# Patient Record
Sex: Male | Born: 1950 | Race: Black or African American | Hispanic: No | Marital: Single | Smoking: Never smoker
Health system: Southern US, Community
[De-identification: ages and names within clinical notes are randomized; demographics above are authoritative.]

## PROBLEM LIST (undated history)

## (undated) HISTORY — PX: HERNIA REPAIR: SHX51

## (undated) HISTORY — PX: FRACTURE SURGERY: SHX138

---

## 1998-04-26 ENCOUNTER — Emergency Department (HOSPITAL_COMMUNITY): Admission: EM | Admit: 1998-04-26 | Discharge: 1998-04-27 | Payer: Self-pay | Admitting: Emergency Medicine

## 1998-11-07 ENCOUNTER — Emergency Department (HOSPITAL_COMMUNITY): Admission: EM | Admit: 1998-11-07 | Discharge: 1998-11-07 | Payer: Self-pay | Admitting: Emergency Medicine

## 1999-08-20 ENCOUNTER — Emergency Department (HOSPITAL_COMMUNITY): Admission: EM | Admit: 1999-08-20 | Discharge: 1999-08-20 | Payer: Self-pay | Admitting: Emergency Medicine

## 2002-04-23 ENCOUNTER — Emergency Department (HOSPITAL_COMMUNITY): Admission: EM | Admit: 2002-04-23 | Discharge: 2002-04-23 | Payer: Self-pay | Admitting: Emergency Medicine

## 2002-04-23 ENCOUNTER — Encounter: Payer: Self-pay | Admitting: Emergency Medicine

## 2004-06-16 ENCOUNTER — Emergency Department (HOSPITAL_COMMUNITY): Admission: EM | Admit: 2004-06-16 | Discharge: 2004-06-16 | Payer: Self-pay

## 2005-05-20 ENCOUNTER — Emergency Department (HOSPITAL_COMMUNITY): Admission: EM | Admit: 2005-05-20 | Discharge: 2005-05-20 | Payer: Self-pay | Admitting: Emergency Medicine

## 2005-10-09 ENCOUNTER — Emergency Department (HOSPITAL_COMMUNITY): Admission: EM | Admit: 2005-10-09 | Discharge: 2005-10-09 | Payer: Self-pay | Admitting: Emergency Medicine

## 2006-05-26 ENCOUNTER — Emergency Department (HOSPITAL_COMMUNITY): Admission: EM | Admit: 2006-05-26 | Discharge: 2006-05-26 | Payer: Self-pay | Admitting: Emergency Medicine

## 2006-08-16 ENCOUNTER — Emergency Department (HOSPITAL_COMMUNITY): Admission: EM | Admit: 2006-08-16 | Discharge: 2006-08-16 | Payer: Self-pay | Admitting: Emergency Medicine

## 2008-06-08 ENCOUNTER — Ambulatory Visit: Payer: Self-pay | Admitting: Internal Medicine

## 2008-06-08 ENCOUNTER — Inpatient Hospital Stay (HOSPITAL_COMMUNITY): Admission: EM | Admit: 2008-06-08 | Discharge: 2008-06-10 | Payer: Self-pay | Admitting: Emergency Medicine

## 2008-06-17 ENCOUNTER — Telehealth (INDEPENDENT_AMBULATORY_CARE_PROVIDER_SITE_OTHER): Payer: Self-pay | Admitting: *Deleted

## 2008-06-27 ENCOUNTER — Ambulatory Visit: Payer: Self-pay | Admitting: Internal Medicine

## 2008-06-27 DIAGNOSIS — R030 Elevated blood-pressure reading, without diagnosis of hypertension: Secondary | ICD-10-CM

## 2008-06-27 DIAGNOSIS — E785 Hyperlipidemia, unspecified: Secondary | ICD-10-CM | POA: Insufficient documentation

## 2008-06-27 DIAGNOSIS — I728 Aneurysm of other specified arteries: Secondary | ICD-10-CM | POA: Insufficient documentation

## 2008-06-27 DIAGNOSIS — K219 Gastro-esophageal reflux disease without esophagitis: Secondary | ICD-10-CM

## 2008-06-27 DIAGNOSIS — R42 Dizziness and giddiness: Secondary | ICD-10-CM

## 2008-09-18 ENCOUNTER — Emergency Department (HOSPITAL_COMMUNITY): Admission: EM | Admit: 2008-09-18 | Discharge: 2008-09-18 | Payer: Self-pay | Admitting: Emergency Medicine

## 2008-09-20 ENCOUNTER — Emergency Department (HOSPITAL_COMMUNITY): Admission: EM | Admit: 2008-09-20 | Discharge: 2008-09-20 | Payer: Self-pay | Admitting: Emergency Medicine

## 2010-03-18 ENCOUNTER — Emergency Department (HOSPITAL_COMMUNITY): Admission: EM | Admit: 2010-03-18 | Discharge: 2010-03-18 | Payer: Self-pay | Admitting: Emergency Medicine

## 2010-05-31 ENCOUNTER — Emergency Department (HOSPITAL_COMMUNITY): Admission: EM | Admit: 2010-05-31 | Discharge: 2010-05-31 | Payer: Self-pay | Admitting: Emergency Medicine

## 2010-06-29 ENCOUNTER — Emergency Department (HOSPITAL_COMMUNITY): Admission: EM | Admit: 2010-06-29 | Discharge: 2010-06-29 | Payer: Self-pay | Admitting: Emergency Medicine

## 2010-08-07 ENCOUNTER — Emergency Department (HOSPITAL_COMMUNITY): Admission: EM | Admit: 2010-08-07 | Discharge: 2010-08-07 | Payer: Self-pay | Admitting: Emergency Medicine

## 2010-12-08 NOTE — Progress Notes (Signed)
Summary: phone/gg  Phone Note Call from Patient   Caller: Patient Summary of Call: Pt called and can not afford Zofran Rx.  Can he have something else? Pt # [id number] Initial call taken by: Merrie Roof RN,  June 17, 2008 10:58 AM  Follow-up for Phone Call        Talked with Dr Maple Hudson and he ordered Phenergan 25 mg take one every 6 hours as needed for nausea # 60 with one refill This was called into [facility]  Pharmacy Follow-up by: Merrie Roof RN,  June 17, 2008 11:05 AM  Additional Follow-up for Phone Call Additional follow up Details #1::        Prescription entered into EMR. Additional Follow-up by: Rufina Falco    New/Updated Medications: PROMETHAZINE HCL 25 MG  TABS (PROMETHAZINE HCL) Take one pill every six hours as needed for nausea   Prescriptions: PROMETHAZINE HCL 25 MG  TABS (PROMETHAZINE HCL) Take one pill every six hours as needed for nausea  #60 x 1   Entered and Authorized by:   Rufina Falco MD   Signed by:   Rufina Falco MD on 06/19/2008   Method used:   Telephoned to ...       [facility]       [address]       [place], [place]  [address]       Ph: [phone]       Fax: [phone]   RxID:   [id number]

## 2011-01-21 LAB — GLUCOSE, CAPILLARY

## 2011-01-26 LAB — URINE MICROSCOPIC-ADD ON

## 2011-01-26 LAB — GC/CHLAMYDIA PROBE AMP, GENITAL: GC Probe Amp, Genital: POSITIVE — AB

## 2011-01-26 LAB — URINALYSIS, ROUTINE W REFLEX MICROSCOPIC
Glucose, UA: NEGATIVE mg/dL
Ketones, ur: NEGATIVE mg/dL
Protein, ur: NEGATIVE mg/dL

## 2011-01-26 LAB — URINE CULTURE
Colony Count: NO GROWTH
Culture: NO GROWTH

## 2011-03-23 NOTE — Discharge Summary (Signed)
NAME:  Donald Kelly, Donald Kelly NO.:  [id number]   MEDICAL RECORD NO.:  [id number]          PATIENT TYPE:  INP   LOCATION:  4738                         FACILITY:  [facility]   PHYSICIAN:  Ileana Roup, M.D.  DATE OF BIRTH:  [birth year]   DATE OF ADMISSION:  06/08/2008  DATE OF DISCHARGE:  06/10/2008                               DISCHARGE SUMMARY   DISCHARGE DIAGNOSES   1. Dizziness/vertigo.  2. Chest pain/heartburn.  3. Elevated blood pressure.  4. Hyperlipidemia.   DISCHARGE MEDICATIONS   1. Meclizine 25 mg 1 pill every 6 hours as needed for dizziness.  2. Prilosec 20 mg 1 pill 2 times daily.  3. Pravachol 40 mg 1 pill daily.  4. Tylenol 325 mg 1-2 pills every 6 hours as needed for pain.  5. Zofran 4 mg 1 pill every 6 hours as needed for nausea.   DISPOSITION AND FOLLOWUP   Followup appointment with Dr. Maple Hudson on June 27, 2008, at 2 p.m. in the  [facility] internal medicine clinic.  Follow up on complaints  of dizziness and reflux and evaluation of the blood pressure.  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  Followup CMETs in 4-6 weeks as we started him on statin.   PROCEDURES PERFORMED   1. CT head without contrast.  Impression, normal CT of the head      without contrast.  2. Abdomen series.  No acute cardiopulmonary disease.  No evidence of      bowel obstruction or free intraperitoneal air.  3. MRI of brain.  No acute intracranial abnormality; normal MRI      appearance of the brain for age; diminutive posterior circulation,      see MRA findings below.  4. MRA head without contrast.  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      These can sometimes be associated with seventh or eight nerve      symptoms, although chronic symptomatology would be expected.  5. X-ray of the chest.  Impression, low volume exam.  No acute chest      process.   CONSULTATIONS  None.   BRIEF ADMITTING HISTORY AND PHYSICAL   Patient is a 60 year old male with past medical history of episodes of  vertigo and dizziness of unknown etiology who is taking meclizine, who  came to the ER with chief complaint of dizziness/vertigo that started  around 4 a.m. on June 08, 2008.  Dizziness started insidiously, and was  aggravated by lying down position and also by turning the head sideways.  Dizziness was relieved by standing  up position.  There is a history of  vertigo, but there is no history of tinnitus or hearing loss.  No  history of fever, nausea, or vomiting.  He had a history of multiple  visits to ER in 2006 and 2007 and to the [facility] with  similar complaints.  He is also complaining of heartburn/chest pain for  quite a long time.  The heartburn starts at around 2-3 hours after  eating food and lasts about 30 minutes; it is retrosternal, sharp, and  nonradiating.  He reports no aggravating factors; it is relieved by  drinking milk.  He denies associated shortness of breath, chills,  palpitations, nausea, or vomiting.  He reports chronic alcohol  consumption of about 2 beers every day and sometimes 4-6 beers a day.  He is taking Advil 200 mg once in a while for chest pain.  He denies any  abdominal pain or diarrhea, and had a bowel movement today.   ALLERGIES   No known allergies.   PAST MEDICAL HISTORY  Similar presentation in June and July 2007, with nausea, vomiting,  dizziness, and intermittent chest pain.  History of vertigo.  Gonorrhea  treated in December 2006 and hiatal hernia repair.   MEDICATIONS AT HOME  Aleve, Advil, and  meclizine.   LABORATORY DATA   His admitting labs are:  CBC:  WBC count 5.2, RBC 4.8, hemoglobin 12.2,  hematocrit 38.7, MCV 80.6, MCHC 31.6, and platelet count 274.  Cardiac  enzymes x3 were negative.  TSH 1.424.  Serum lipase 21.  PT 13.4, INR  1.0, PTT 26.  Urinalysis is negative.  Urine for the drug screen is  negative.  HbA1c is 5.5.  BMP:  Sodium 138, potassium 4.4, chloride 108,  carbon dioxide 26, glucose 130, BUN 5, and creatinine 0.79.  Total  bilirubin 1.1, alkaline phosphatase 28, SGOT 20, SGPT 26, total protein  6.9, albumin blood 3.8, and calcium 8.9.   EKG was normal.   HOSPITAL COURSE   1. Dizziness/vertigo.  Symptoms are likely secondary to benign      paroxysmal positional vertigo.  The patient has a past history of      vertigo.  CT head was negative.  There were no acute neurological      findings.  The patient was treated with meclizine for the dizziness      and with Zofran for nausea.  He had an MRA/MRI of the head to rule      out any posterior circulation stroke with results as above.  We      talked with the Radiology regarding the MRI/MRA findings.  They      felt that the MRI/MRA findings would not likely result in the      dizziness of this patient.  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      The patient responded to meclizine and Zofran.  The patient did not      have any dizziness on the second day of the admission.  The patient      was later discharged on meclizine and Zofran.  2. Chest pain/heartburn.  The chest pain was likely GI in nature, with      differential including peptic ulcer disease versus GERD.  His serum      lipase levels were negative.  EKG was normal.  Cardiac enzymes x3  were normal.  Urinalysis was normal.  Urine for drug screening was      normal.  We started him on a PPI and the patient responded nicely.      We sent him home on Prilosec 20 mg 1 pill  2 times daily.  3. Increased blood pressures.  The patient had multiple readings of      increased blood pressure and the patient will likely need treatment      as an outpatient.  We are not starting him on any treatment for the      increased blood pressure.  He has an appointment in the outpatient      clinic with Dr. Maple Hudson on June 27, 2008, at 2 p.m. and he will      monitor the blood pressure and decide the need for any medications      for high blood pressure.  4. Alcohol use.  We started him on thiamine as well as folic acid      supplementation.  We advised the patient on alcohol cessation.  5. Prophylaxis.  SCDs were used for DVT prophylaxis.   DISCHARGE VITALS   His temperature is 97.2, pulse 71, respirations 20, blood pressure  151/91, and oxygen saturation 94% on room air.   DISCHARGE LABS   CBC:  WBC count 5.8, RBC 5.56, hemoglobin 14.1, hematocrit 45.3, MCV  81.4, MCHC 31.0, RDW 14.1, and platelet count 316.  Basic Metabolic  Panel:  Sodium 138, potassium 4.1, chloride 105, carbon dioxide 25,  glucose 16, BUN 13, creatinine 0.59, and calcium 9.3.   At the time of discharge patient was stable, alert, oriented and not in  any distress.      Blondell Reveal, MD  Electronically Signed      Ileana Roup, M.D.  Electronically Signed    VB/MEDQ  D:  06/10/2008  T:  06/11/2008  Job:  [id number]

## 2011-08-06 LAB — LIPID PANEL
Cholesterol: 279 — ABNORMAL HIGH
HDL: 30 — ABNORMAL LOW
LDL Cholesterol: 218 — ABNORMAL HIGH
Total CHOL/HDL Ratio: 9.3
Triglycerides: 156 — ABNORMAL HIGH
VLDL: 31

## 2011-08-06 LAB — DIFFERENTIAL
Basophils Absolute: 0
Basophils Relative: 1
Monocytes Absolute: 0.3
Neutro Abs: 3.3
Neutrophils Relative %: 65

## 2011-08-06 LAB — CBC
HCT: 39.1
Hemoglobin: 12.2 — ABNORMAL LOW
Hemoglobin: 12.5 — ABNORMAL LOW
MCHC: 31.6
MCHC: 32
MCV: 79.7
Platelets: 316
RDW: 14.1
RDW: 14.2
RDW: 14.3
WBC: 5.8

## 2011-08-06 LAB — BASIC METABOLIC PANEL
BUN: 13
CO2: 25
Calcium: 9
Calcium: 9.3
Chloride: 110
Creatinine, Ser: 0.89
GFR calc non Af Amer: >60
Glucose, Bld: 109 — ABNORMAL HIGH
Glucose, Bld: 156 — ABNORMAL HIGH
Sodium: 141

## 2011-08-06 LAB — URINE CULTURE
Colony Count: NO GROWTH
Culture: NO GROWTH

## 2011-08-06 LAB — CARDIAC PANEL(CRET KIN+CKTOT+MB+TROPI)
Relative Index: 1.6
Total CK: 134
Total CK: 154

## 2011-08-06 LAB — URINALYSIS, ROUTINE W REFLEX MICROSCOPIC
Glucose, UA: NEGATIVE
Ketones, ur: NEGATIVE
pH: 6.5

## 2011-08-06 LAB — HEMOGLOBIN A1C
Hgb A1c MFr Bld: 5.5
Mean Plasma Glucose: 119

## 2011-08-06 LAB — COMPREHENSIVE METABOLIC PANEL
AST: 20
Albumin: 3.8
Alkaline Phosphatase: 48
Chloride: 108
GFR calc Af Amer: >60
Potassium: 4.4
Sodium: 138
Total Bilirubin: 1.1

## 2011-08-06 LAB — RAPID URINE DRUG SCREEN, HOSP PERFORMED: Tetrahydrocannabinol: NOT DETECTED

## 2011-08-06 LAB — POCT I-STAT, CHEM 8
Calcium, Ion: 1.18
Creatinine, Ser: 1.1
Glucose, Bld: 156 — ABNORMAL HIGH
Hemoglobin: 13.9
TCO2: 24

## 2011-08-06 LAB — APTT: aPTT: 26

## 2011-08-06 LAB — TSH: TSH: 1.424

## 2011-08-10 LAB — URINE CULTURE: Colony Count: NO GROWTH

## 2011-08-10 LAB — URINALYSIS, ROUTINE W REFLEX MICROSCOPIC
Bilirubin Urine: NEGATIVE
Bilirubin Urine: NEGATIVE
Glucose, UA: NEGATIVE
Nitrite: NEGATIVE
Specific Gravity, Urine: 1.006
Specific Gravity, Urine: 1.012
Urobilinogen, UA: 0.2
pH: 6
pH: 6

## 2011-08-10 LAB — DIFFERENTIAL
Eosinophils Relative: 1
Lymphocytes Relative: 30
Monocytes Absolute: 0.3
Monocytes Relative: 4
Neutro Abs: 4.4

## 2011-08-10 LAB — CBC
HCT: 42.5
Hemoglobin: 13.7
RBC: 5.34
RDW: 14.1

## 2011-08-10 LAB — URINE MICROSCOPIC-ADD ON

## 2011-08-10 LAB — POCT I-STAT, CHEM 8
BUN: 11
Calcium, Ion: 1.21
Creatinine, Ser: 1
Glucose, Bld: 88
Sodium: 141
TCO2: 25

## 2012-05-23 IMAGING — CR DG CHEST 2V
2 series · 2 of 2 positions shown · non-contrast
Comparison: Chest 06/08/2008.

CLINICAL DATA: Cough and weakness.

CHEST - 2 VIEW

[w chest pa]
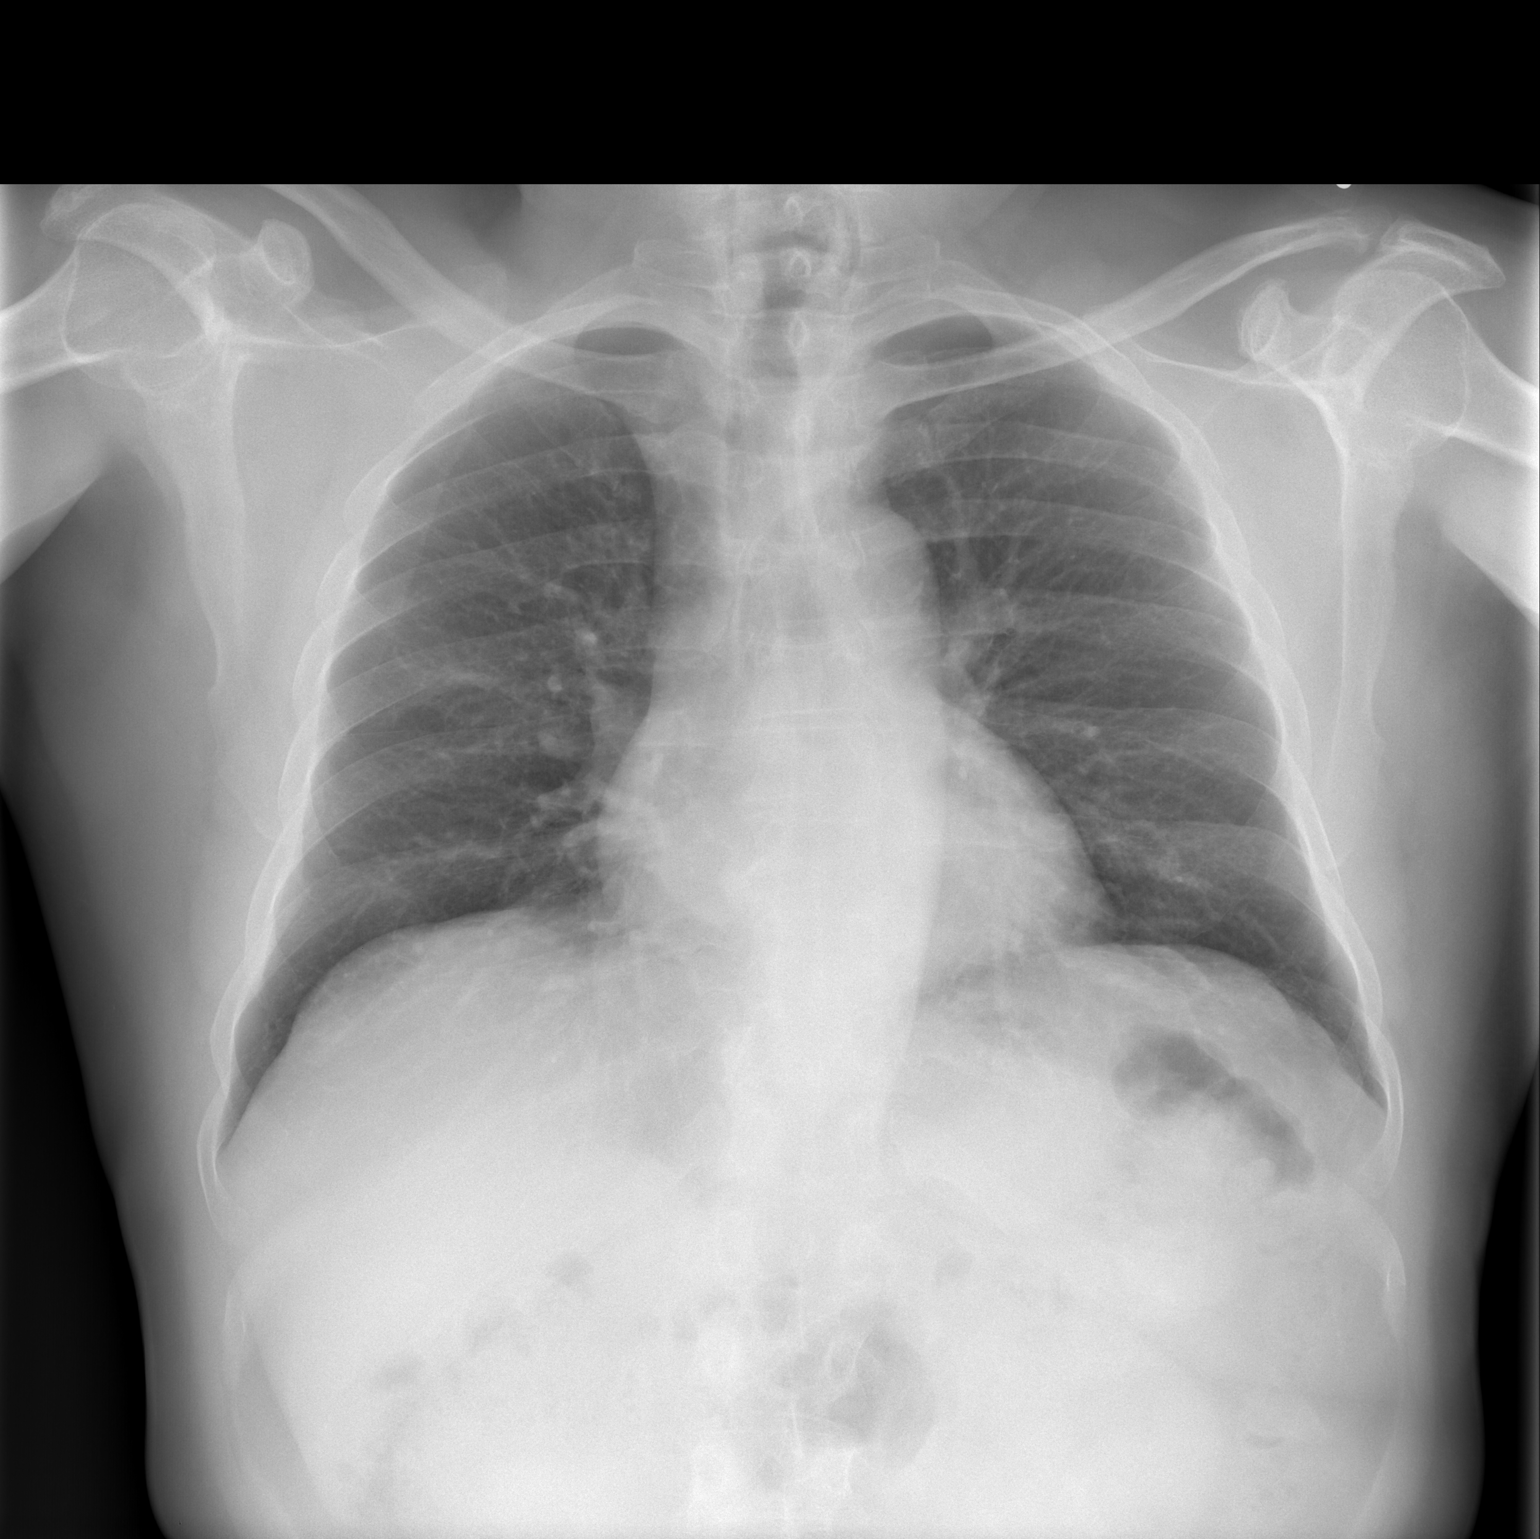

[w chest lat]
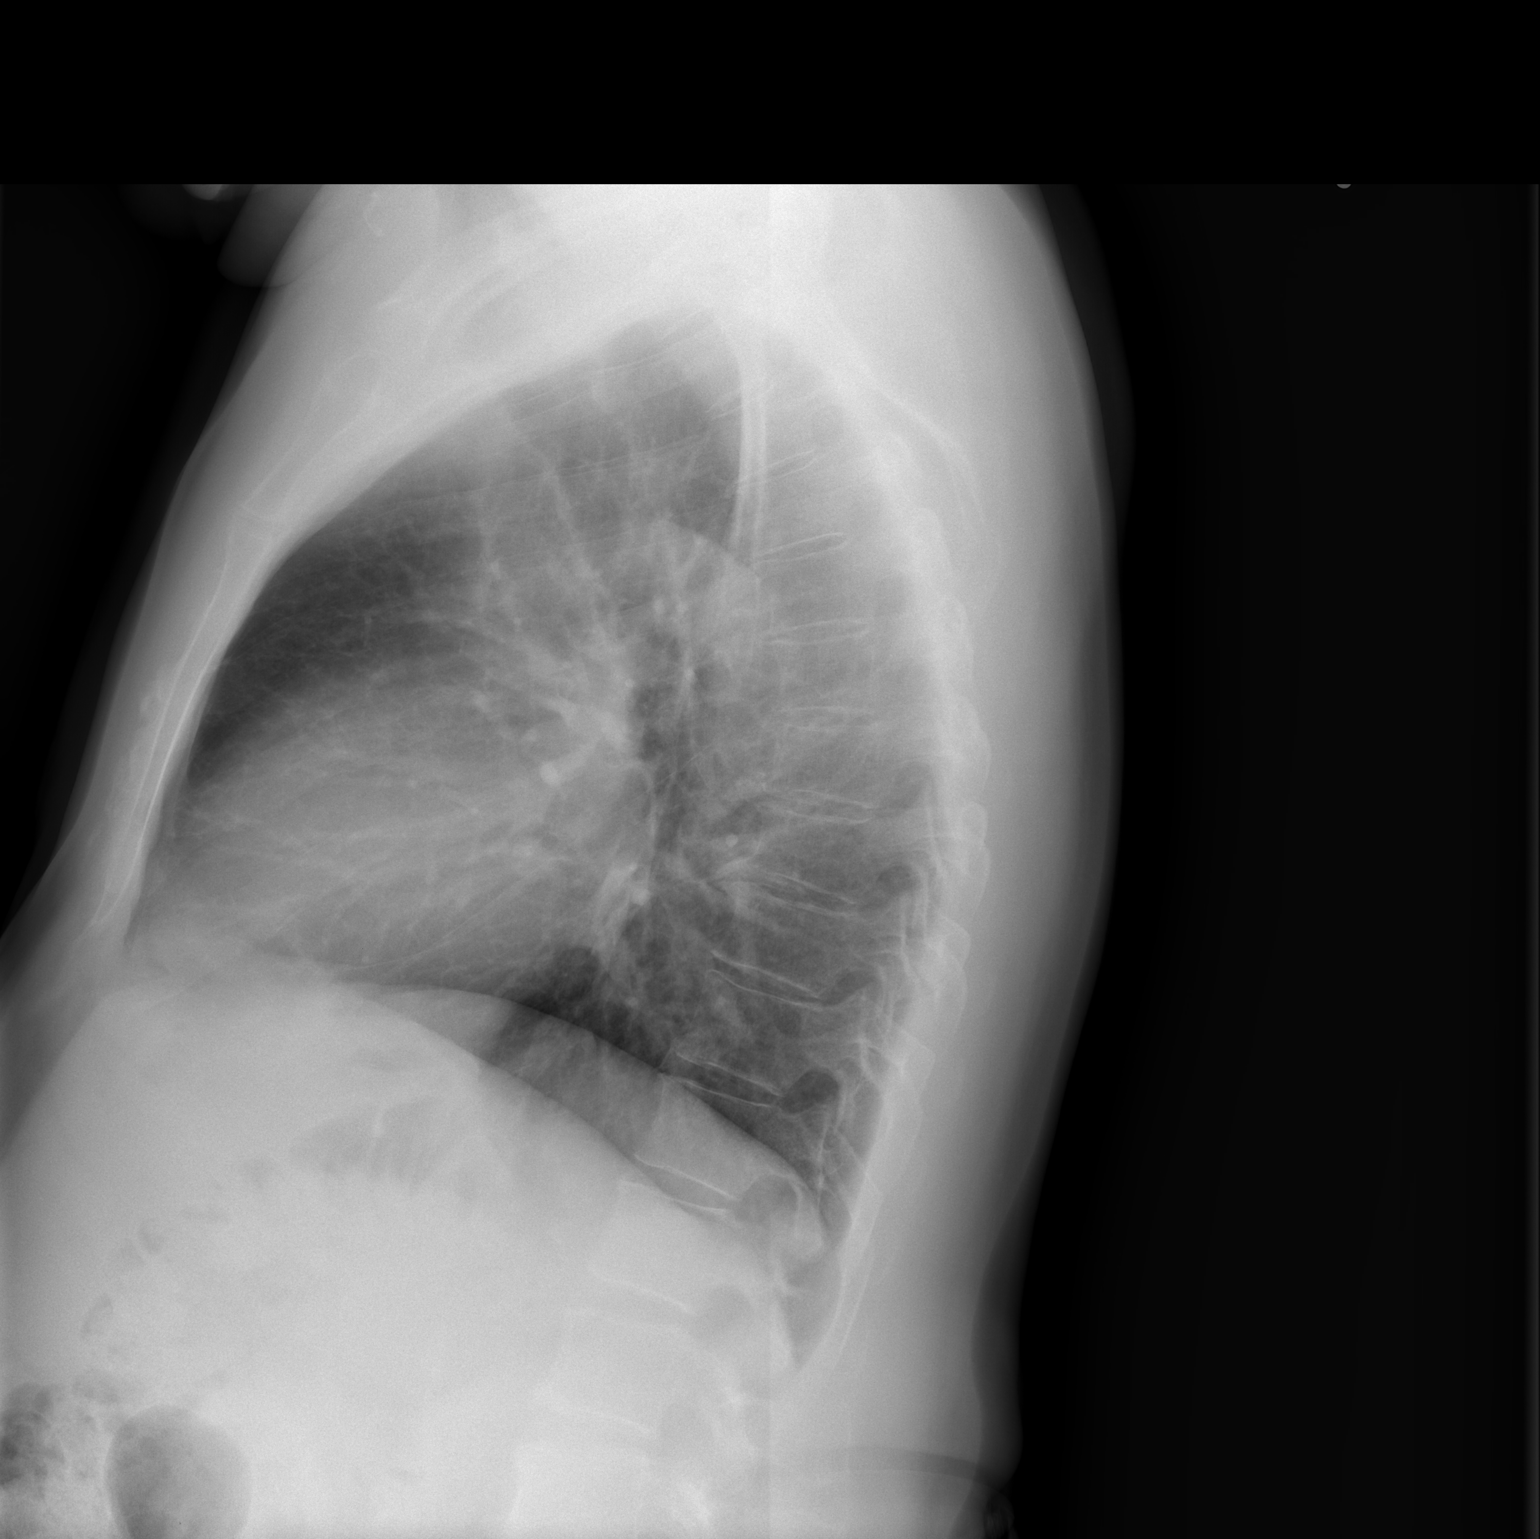

[2 of 2 positions shown; findings below may reference images not displayed]

FINDINGS: Lungs are clear.  No effusion.  Heart size normal.
IMPRESSION: No acute disease.

## 2012-06-22 ENCOUNTER — Emergency Department (HOSPITAL_COMMUNITY)
Admission: EM | Admit: 2012-06-22 | Discharge: 2012-06-22 | Disposition: A | Discharge status: Home / Self Care | Payer: Self-pay | Attending: Emergency Medicine | Admitting: Emergency Medicine

## 2012-06-22 DIAGNOSIS — M79609 Pain in unspecified limb: Secondary | ICD-10-CM | POA: Insufficient documentation

## 2012-06-22 DIAGNOSIS — L03039 Cellulitis of unspecified toe: Secondary | ICD-10-CM | POA: Insufficient documentation

## 2012-06-22 DIAGNOSIS — L03031 Cellulitis of right toe: Secondary | ICD-10-CM

## 2012-06-22 MED ORDER — CEPHALEXIN 500 MG PO CAPS: 500.0000 mg | ORAL_CAPSULE | Freq: Three times a day (TID) | ORAL | Status: AC

## 2012-06-22 MED ORDER — CEPHALEXIN 500 MG PO CAPS
500.0000 mg | ORAL_CAPSULE | Freq: Once | ORAL | Status: AC
  Administered 2012-06-22: 500 mg via ORAL
  Filled 2012-06-22: qty 1

## 2012-06-22 MED ORDER — TRAMADOL HCL 50 MG PO TABS
50.0000 mg | ORAL_TABLET | Freq: Once | ORAL | Status: AC
  Administered 2012-06-22: 50 mg via ORAL
  Filled 2012-06-22: qty 1

## 2012-06-22 MED ORDER — TRAMADOL HCL 50 MG PO TABS: 50.0000 mg | ORAL_TABLET | Freq: Four times a day (QID) | ORAL | Status: AC | PRN

## 2012-06-22 NOTE — ED Notes (Signed)
Pt verbalizes understanding 

## 2012-06-22 NOTE — ED Provider Notes (Signed)
History     CSN: [id number]  Arrival date & time 06/22/12  1845   First MD Initiated Contact with Patient 06/22/12 1848      Chief Complaint  Patient presents with  . Toe Pain    (Consider location/radiation/quality/duration/timing/severity/associated sxs/prior treatment) HPI  61 y.o. male in no acute distress complaining of pain to right great toe worsening over the course of one week with expression of purulent material. Patient states he is not in pain except for when he wears shoes and at that point it is 5/10, sharp, nonradiating and exacerbated with weightbearing. Patient has never had a similar prior episode and he denies fever, nausea and vomiting.   No past medical history on file.  No past surgical history on file.  No family history on file.  History  Substance Use Topics  . Smoking status: Not on file  . Smokeless tobacco: Not on file  . Alcohol Use: Not on file      Review of Systems  Musculoskeletal: Positive for myalgias.  All other systems reviewed and are negative.    Allergies  Review of patient's allergies indicates no known allergies.  Home Medications   Current Outpatient Rx  Name Route Sig Dispense Refill  . CEPHALEXIN 500 MG PO CAPS Oral Take 1 capsule (500 mg total) by mouth 3 (three) times daily. 21 capsule 0  . TRAMADOL HCL 50 MG PO TABS Oral Take 1 tablet (50 mg total) by mouth every 6 (six) hours as needed for pain. 15 tablet 0    BP 135/87  Pulse 73  Temp 98.2 F (36.8 C) (Oral)  Resp 14  SpO2 99%  Physical Exam  Nursing note and vitals reviewed. Constitutional: He is oriented to person, place, and time. He appears well-developed and well-nourished. No distress.  HENT:  Head: Normocephalic.  Eyes: Conjunctivae and EOM are normal.  Cardiovascular: Normal rate.   Pulmonary/Chest: Effort normal.  Musculoskeletal: Normal range of motion.  Neurological: He is alert and oriented to person, place, and time.  Skin:   Paronychia to the right great toe with erythema and swelling to the base of the nail bed.  Psychiatric: He has a normal mood and affect.    ED Course  Procedures (including critical care time)  INCISION AND DRAINAGE Performed by: Wynetta Emery Consent: Verbal consent obtained. Risks and benefits: risks, benefits and alternatives were discussed Type: Paronychia   Body area: Right great toe  Anesthesia: None  Local anesthetic: None   Anesthetic total: None  Complexity: c simple  Drainage: purulent  Drainage amount: 0.58mL  Packing material: None   Patient tolerance: Patient tolerated the procedure well with no immediate complications.     Labs Reviewed - No data to display No results found.   1. Paronychia of great toe, right       MDM  Patient with right great toe paronychia attempted incision and drainage but produces small amount of purulent drainage. I think the majority of the pus previously expressed. I'm going to start the patient on Keflex and prescribe him tramadol for pain.        Wynetta Emery, PA-C 06/22/12 1942

## 2012-06-22 NOTE — ED Provider Notes (Signed)
Medical screening examination/treatment/procedure(s) were performed by non-physician practitioner and as supervising physician I was immediately available for consultation/collaboration.  Elder Davidian T Dilana Mcphie, MD 06/22/12 2319 

## 2012-06-22 NOTE — ED Notes (Signed)
Pt denies chills and fever.  Pt reports "mashing puss out of it"

## 2012-06-22 NOTE — ED Notes (Signed)
Pt unaware of any toe injury.  Pt states "been hurting for a week"  Pt right toe red and swollen

## 2012-11-06 ENCOUNTER — Encounter (HOSPITAL_COMMUNITY): Payer: Self-pay | Admitting: *Deleted

## 2012-11-06 ENCOUNTER — Emergency Department (HOSPITAL_COMMUNITY): Payer: Self-pay

## 2012-11-06 ENCOUNTER — Emergency Department (HOSPITAL_COMMUNITY)
Admission: EM | Admit: 2012-11-06 | Discharge: 2012-11-06 | Disposition: A | Discharge status: Home / Self Care | Payer: Self-pay | Attending: Emergency Medicine | Admitting: Emergency Medicine

## 2012-11-06 DIAGNOSIS — S39012A Strain of muscle, fascia and tendon of lower back, initial encounter: Secondary | ICD-10-CM

## 2012-11-06 DIAGNOSIS — Y929 Unspecified place or not applicable: Secondary | ICD-10-CM | POA: Insufficient documentation

## 2012-11-06 DIAGNOSIS — S335XXA Sprain of ligaments of lumbar spine, initial encounter: Secondary | ICD-10-CM | POA: Insufficient documentation

## 2012-11-06 DIAGNOSIS — X500XXA Overexertion from strenuous movement or load, initial encounter: Secondary | ICD-10-CM | POA: Insufficient documentation

## 2012-11-06 DIAGNOSIS — Z9889 Other specified postprocedural states: Secondary | ICD-10-CM | POA: Insufficient documentation

## 2012-11-06 DIAGNOSIS — Y990 Civilian activity done for income or pay: Secondary | ICD-10-CM | POA: Insufficient documentation

## 2012-11-06 MED ORDER — CYCLOBENZAPRINE HCL 10 MG PO TABS: 5.0000 mg | ORAL_TABLET | Freq: Two times a day (BID) | ORAL | Status: DC | PRN

## 2012-11-06 MED ORDER — HYDROCODONE-ACETAMINOPHEN 5-325 MG PO TABS
1.0000 | ORAL_TABLET | ORAL | Status: DC | PRN
Start: 2012-11-06 — End: 2012-11-06

## 2012-11-06 MED ORDER — IBUPROFEN 800 MG PO TABS: 800.0000 mg | ORAL_TABLET | Freq: Three times a day (TID) | ORAL | Status: DC

## 2012-11-06 MED ORDER — HYDROCODONE-ACETAMINOPHEN 5-325 MG PO TABS
1.0000 | ORAL_TABLET | ORAL | Status: DC | PRN
Start: 2012-11-06 — End: 2013-10-13

## 2012-11-06 NOTE — ED Notes (Signed)
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

## 2012-11-23 NOTE — ED Provider Notes (Signed)
History     CSN: [id number]  Arrival date & time 11/06/12  4098   First MD Initiated Contact with Patient 11/06/12 0940      No chief complaint on file.   (Consider location/radiation/quality/duration/timing/severity/associated sxs/prior treatment) Patient is a 62 y.o. male presenting with hip pain. The history is provided by the patient.  Hip Pain This is a new problem. The current episode started yesterday. Pertinent negatives include no chills, fever, numbness, rash or weakness. Associated symptoms comments: He complains of left hip soreness, worse with walking, better when resting, since lifting a tray at work. No numbness or tingling. No weakness, urinary or bowel incontinence. He states mild back pain but greatest discomfort is in the hip.Marland Kitchen    History reviewed. No pertinent past medical history.  Past Surgical History  Procedure Date  . Hernia repair     History reviewed. No pertinent family history.  History  Substance Use Topics  . Smoking status: Never Smoker   . Smokeless tobacco: Never Used  . Alcohol Use: Yes      Review of Systems  Constitutional: Negative for fever and chills.  Genitourinary: Negative for difficulty urinating.  Musculoskeletal:       See HPI  Skin: Negative.  Negative for rash and wound.  Neurological: Negative.  Negative for weakness and numbness.    Allergies  Review of patient's allergies indicates no known allergies.  Home Medications   Current Outpatient Rx  Name  Route  Sig  Dispense  Refill  . CYCLOBENZAPRINE HCL 10 MG PO TABS   Oral   Take 0.5 tablets (5 mg total) by mouth 2 (two) times daily as needed for muscle spasms.   20 tablet   0   . HYDROCODONE-ACETAMINOPHEN 5-325 MG PO TABS   Oral   Take 1 tablet by mouth every 4 (four) hours as needed for pain.   10 tablet   0   . IBUPROFEN 800 MG PO TABS   Oral   Take 1 tablet (800 mg total) by mouth 3 (three) times daily.   21 tablet   0     BP 175/98  Pulse  74  Temp 98 F (36.7 C) (Oral)  Resp 16  SpO2 100%  Physical Exam  Constitutional: He is oriented to person, place, and time. He appears well-developed and well-nourished.  Neck: Normal range of motion.  Cardiovascular:       Distal pulses 2+.  Pulmonary/Chest: Effort normal.  Abdominal: Soft. He exhibits no mass. There is no tenderness.  Musculoskeletal: Normal range of motion.       Left paralumbar tenderness that is mild and reproduces pain in left hip. No swelling, discoloration. No sciatic tenderness.   Neurological: He is alert and oriented to person, place, and time. He has normal reflexes. No sensory deficit.  Skin: Skin is warm and dry.  Psychiatric: He has a normal mood and affect.    ED Course  Procedures (including critical care time)  Labs Reviewed - No data to display No results found.   1. Lumbar strain       MDM  Suspect hip pain is mild lumbar radiculopathy from back strain. No neurologic deficits.         Arnoldo Hooker, PA-C 11/26/12 1030

## 2012-11-26 NOTE — ED Provider Notes (Signed)
Medical screening examination/treatment/procedure(s) were performed by non-physician practitioner and as supervising physician I was immediately available for consultation/collaboration.   Dione Booze, MD 11/26/12 [phone]

## 2012-11-28 ENCOUNTER — Encounter: Payer: Self-pay | Admitting: Medical

## 2013-10-13 ENCOUNTER — Other Ambulatory Visit: Payer: Self-pay

## 2013-10-13 ENCOUNTER — Emergency Department (HOSPITAL_COMMUNITY)
Admission: EM | Admit: 2013-10-13 | Discharge: 2013-10-13 | Disposition: A | Discharge status: Home / Self Care | Payer: Self-pay | Attending: Emergency Medicine | Admitting: Emergency Medicine

## 2013-10-13 ENCOUNTER — Encounter (HOSPITAL_COMMUNITY): Payer: Self-pay | Admitting: Emergency Medicine

## 2013-10-13 DIAGNOSIS — I10 Essential (primary) hypertension: Secondary | ICD-10-CM | POA: Insufficient documentation

## 2013-10-13 DIAGNOSIS — R5381 Other malaise: Secondary | ICD-10-CM | POA: Insufficient documentation

## 2013-10-13 DIAGNOSIS — R51 Headache: Secondary | ICD-10-CM | POA: Insufficient documentation

## 2013-10-13 LAB — CBC
MCH: 25.6 pg — ABNORMAL LOW (ref 26.0–34.0)
MCHC: 32.4 g/dL (ref 30.0–36.0)
MCV: 78.9 fL (ref 78.0–100.0)
Platelets: 305 10*3/uL (ref 150–400)
RBC: 5.36 MIL/uL (ref 4.22–5.81)

## 2013-10-13 LAB — BASIC METABOLIC PANEL
CO2: 25 mEq/L (ref 19–32)
Calcium: 9.8 mg/dL (ref 8.4–10.5)
Creatinine, Ser: 0.9 mg/dL (ref 0.50–1.35)
GFR calc non Af Amer: 89 mL/min — ABNORMAL LOW (ref >90)

## 2013-10-13 LAB — URINALYSIS, ROUTINE W REFLEX MICROSCOPIC
Leukocytes, UA: NEGATIVE
Nitrite: NEGATIVE
Protein, ur: NEGATIVE mg/dL
Specific Gravity, Urine: 1.011 (ref 1.005–1.030)
pH: 7 (ref 5.0–8.0)

## 2013-10-13 NOTE — ED Provider Notes (Addendum)
CSN: [id number]     Arrival date & time 10/13/13  4098 History   First MD Initiated Contact with Patient 10/13/13 [phone]     Chief Complaint  Patient presents with  . Fatigue  . Hypertension   (Consider location/radiation/quality/duration/timing/severity/associated sxs/prior Treatment) HPI Pt presents with c/o concern for generalized fatigue, frontal headaches and has found that he had high blood pressure at a reading at drug store last night.  He states he has been intermittently fatigued over the past 5-6 weeks, also c/o intermittent frontal headaches- worse when he lies down to sleep at night.  No chest pain, no focal weakness.  No changes in vision or speech.  No SOB.  He states last night at drug strore his blood pressure was 160.  Today he states he has a mild frontal headache, but has a normal energy level.  There are no other associated systemic symptoms, there are no other alleviating or modifying factors.   History reviewed. No pertinent past medical history. Past Surgical History  Procedure Laterality Date  . Hernia repair     No family history on file. History  Substance Use Topics  . Smoking status: Never Smoker   . Smokeless tobacco: Never Used  . Alcohol Use: Yes    Review of Systems ROS reviewed and all otherwise negative except for mentioned in HPI  Allergies  Review of patient's allergies indicates no known allergies.  Home Medications   Current Outpatient Rx  Name  Route  Sig  Dispense  Refill  . ibuprofen (ADVIL,MOTRIN) 200 MG tablet   Oral   Take 600 mg by mouth every 6 (six) hours as needed for moderate pain.          BP 148/81  Pulse 71  Temp(Src) 98.1 F (36.7 C) (Oral)  Resp 16  SpO2 98% Vitals reviewed Physical Exam Physical Examination: General appearance - alert, well appearing, and in no distress Mental status - alert, oriented to person, place, and time Eyes - pupils equal and reactive, extraocular eye movements intact Mouth - mucous  membranes moist, pharynx normal without lesions Chest - clear to auscultation, no wheezes, rales or rhonchi, symmetric air entry Heart - normal rate, regular rhythm, normal S1, S2, no murmurs, rubs, clicks or gallops Abdomen - soft, nontender, nondistended, no masses or organomegaly, nontender Neurological - alert, oriented, normal speech, cranial nerves grossly intact, strength 5/5 in extremities x 4, sensation intac Extremities - peripheral pulses normal, no pedal edema, no clubbing or cyanosis Skin - normal coloration and turgor, no rashes  ED Course  Procedures (including critical care time) Labs Review Labs Reviewed  CBC - Abnormal; Notable for the following:    MCH 25.6 (*)    All other components within normal limits  BASIC METABOLIC PANEL - Abnormal; Notable for the following:    Glucose, Bld 112 (*)    GFR calc non Af Amer 89 (*)    All other components within normal limits  URINALYSIS, ROUTINE W REFLEX MICROSCOPIC   Imaging Review No results found.  EKG Interpretation   None      Date: 10/13/2013  Rate: 61  Rhythm: normal sinus rhythm  QRS Axis: normal  Intervals: normal  ST/T Wave abnormalities: early repolarization  Conduction Disutrbances:none  Narrative Interpretation:   Old EKG Reviewed: none available  This EKG is not available in MUSE for me to interpret there.    MDM   1. Hypertension    Pt presenting with concern about hypertension.  He  has been having fatigue and intermittent headaches.  Today he has normal exam.  No concerning symptoms for end organ damage and no evidence of this on EKG, urine or blood workup.  Pt advised that workup was reassuring today and that he would need to f/u with PMD to have BP rechecked to determine if he needs to start on BP meds.  Discharged with strict return precautions.  Pt agreeable with plan.    Ethelda Chick, MD 10/13/13 1316  Ethelda Chick, MD 10/13/13 [phone]

## 2013-10-13 NOTE — ED Notes (Signed)
Pt from home reports that he has been feeling more tired and weak for 1 month. Pt states that he checked BP at drug store last pm and it was in the "160's". Pt denies CP, SOB, N/V/D, blood in stool or urine. Pt is A&O and in NAD

## 2014-07-18 ENCOUNTER — Emergency Department (HOSPITAL_COMMUNITY)
Admission: EM | Admit: 2014-07-18 | Discharge: 2014-07-18 | Discharge status: Against Medical Advice | Payer: Self-pay | Attending: Emergency Medicine | Admitting: Emergency Medicine

## 2014-07-18 DIAGNOSIS — Z5320 Procedure and treatment not carried out because of patient's decision for unspecified reasons: Secondary | ICD-10-CM | POA: Insufficient documentation

## 2015-08-05 ENCOUNTER — Emergency Department (HOSPITAL_COMMUNITY): Payer: Medicare Other

## 2015-08-05 ENCOUNTER — Emergency Department (HOSPITAL_COMMUNITY)
Admission: EM | Admit: 2015-08-05 | Discharge: 2015-08-05 | Disposition: A | Discharge status: Home / Self Care | Payer: Medicare Other | Attending: Emergency Medicine | Admitting: Emergency Medicine

## 2015-08-05 ENCOUNTER — Encounter (HOSPITAL_COMMUNITY): Payer: Self-pay | Admitting: *Deleted

## 2015-08-05 DIAGNOSIS — J3489 Other specified disorders of nose and nasal sinuses: Secondary | ICD-10-CM | POA: Diagnosis not present

## 2015-08-05 DIAGNOSIS — R05 Cough: Secondary | ICD-10-CM | POA: Insufficient documentation

## 2015-08-05 DIAGNOSIS — R059 Cough, unspecified: Secondary | ICD-10-CM

## 2015-08-05 MED ORDER — BENZONATATE 100 MG PO CAPS: 100.0000 mg | ORAL_CAPSULE | Freq: Three times a day (TID) | ORAL | Status: DC | PRN

## 2015-08-05 NOTE — ED Notes (Signed)
Discussed discharge instructions with patient, no further questions or needs. Departed ambulatory with no apparent distress.

## 2015-08-05 NOTE — Discharge Instructions (Signed)
Cough, Adult ° A cough is a reflex that helps clear your throat and airways. It can help heal the body or may be a reaction to an irritated airway. A cough may only last 2 or 3 weeks (acute) or may last more than 8 weeks (chronic).  °CAUSES °Acute cough: °· Viral or bacterial infections. °Chronic cough: °· Infections. °· Allergies. °· Asthma. °· Post-nasal drip. °· Smoking. °· Heartburn or acid reflux. °· Some medicines. °· Chronic lung problems (COPD). °· Cancer. °SYMPTOMS  °· Cough. °· Fever. °· Chest pain. °· Increased breathing rate. °· High-pitched whistling sound when breathing (wheezing). °· Colored mucus that you cough up (sputum). °TREATMENT  °· A bacterial cough may be treated with antibiotic medicine. °· A viral cough must run its course and will not respond to antibiotics. °· Your caregiver may recommend other treatments if you have a chronic cough. °HOME CARE INSTRUCTIONS  °· Only take over-the-counter or prescription medicines for pain, discomfort, or fever as directed by your caregiver. Use cough suppressants only as directed by your caregiver. °· Use a cold steam vaporizer or humidifier in your bedroom or home to help loosen secretions. °· Sleep in a semi-upright position if your cough is worse at night. °· Rest as needed. °· Stop smoking if you smoke. °SEEK IMMEDIATE MEDICAL CARE IF:  °· You have pus in your sputum. °· Your cough starts to worsen. °· You cannot control your cough with suppressants and are losing sleep. °· You begin coughing up blood. °· You have difficulty breathing. °· You develop pain which is getting worse or is uncontrolled with medicine. °· You have a fever. °MAKE SURE YOU:  °· Understand these instructions. °· Will watch your condition. °· Will get help right away if you are not doing well or get worse. °Document Released: 04/23/2011 Document Revised: 01/17/2012 Document Reviewed: 04/23/2011 °ExitCare® Patient Information ©2015 [employer]. This information is not intended  to replace advice given to you by your health care provider. Make sure you discuss any questions you have with your health care provider. ° °Emergency Department Resource Guide °1) Find a Doctor and Pay Out of Pocket °Although you won't have to find out who is covered by your insurance plan, it is a good idea to ask around and get recommendations. You will then need to call the office and see if the doctor you have chosen will accept you as a new patient and what types of options they offer for patients who are self-pay. Some doctors offer discounts or will set up payment plans for their patients who do not have insurance, but you will need to ask so you aren't surprised when you get to your appointment. ° °2) Contact Your Local Health Department °Not all health departments have doctors that can see patients for sick visits, but many do, so it is worth a call to see if yours does. If you don't know where your local health department is, you can check in your phone book. The CDC also has a tool to help you locate your state's health department, and many state websites also have listings of all of their local health departments. ° °3) Find a Walk-in Clinic °If your illness is not likely to be very severe or complicated, you may want to try a walk in clinic. These are popping up all over the country in pharmacies, drugstores, and shopping centers. They're usually staffed by nurse practitioners or physician assistants that have been trained to treat common illnesses   and complaints. They're usually fairly quick and inexpensive. However, if you have serious medical issues or chronic medical problems, these are probably not your best option. ° °No Primary Care Doctor: °- Call [facility] at  832-8000 - they can help you locate a primary care doctor that  accepts your insurance, provides certain services, etc. °- Physician Referral Service- 1-800-533-3463 ° °Chronic Pain Problems: °Organization         Address  Phone    Notes  °[facility]  (336) 297-2271 Patients need to be referred by their primary care doctor.  ° °Medication Assistance: °Organization         Address  Phone   Notes  °[place] [facility] [address] °[place], [place] [address] (336) 641-8030 --Must be a resident of [place] °-- Must have NO insurance coverage whatsoever (no Medicaid/ Medicare, etc.) °-- The pt. MUST have a primary care doctor that directs their care regularly and follows them in the community °  °[facility]  (866) 331-1348   °[employer]  (888) 892-1162   ° °Agencies that provide inexpensive medical care: °Organization         Address  Phone   Notes  °[facility]  (336) 832-8035   °[facility]    (336) 832-7272   °[facility] [address] °[place], [place] [address] (336) 832-4777   °[facility] [address], °[place] (336) 271-4999   °[facility]    (336) 373-0678   °[facility]    (336) 272-1050   °[facility] ° [address], [place] Phone:  (336) 832-4444, Fax:  (336) 832-4440 Hours of Operation:  9 am - 6 pm, M-F.  Also accepts Medicaid/Medicare and self-pay.  °[facility] ° [address], [place] Phone: (336) 832-3150, Fax: (336) 832-3151. Hours of Operation:  8:30 am - 5:30 pm, M-F.  Also accepts Medicaid and self-pay.  °[facility] [place] [address], [place] Phone: (336) 878-6027   °[facility] [address], [place], [place] (336)723-1848, Ext. 123 Mondays & Thursdays: 7-9 AM.  First 15 patients are seen on a first come, first serve basis. °  ° °Medicaid-accepting [place] Providers: ° °Organization         Address  Phone   Notes  °[facility] [address], [place] (336) 641-2100 Also accepts self-pay patients.  °[facility]  [address], [place] ° (336) 856-9996   °[facility] [address], [place] (336) 288-8857   °[facility] [address] [place] [address], [place] (336) 299-7000   °Veita Bland [address], [place]  ° (336) 373-1557 Only accepts [place] Access Medicaid patients after they have their name applied to their card.  ° °Self-Pay (no insurance) in [place]: ° °Organization         Address  Phone   Notes  °Sickle Cell Patients, [facility] [address], [place] (336) 832-1970   °[facility] [address], [place] (336) 832-4400   °[facility] ° [address], [place] (336) 992-4800   °[facility]/Dr. Osei-Bonsu ° [address] [place] [address],  or [address], [place] (336) 841-8500 Phone number for both [place] and [place] locations is the same.  °[facility] [address], [place] (336) 299-0000   °[facility] [address] [place] [address], [place] or [address] (336) 852-7530 °(336) 878-2260   °[facility] [address], [place] (336) 350-1642, phone; (336) 294-5005, fax Sees patients 1st and 3rd Saturday of every month.  Must not qualify for public or private insurance (i.e. Medicaid, Medicare, [place] Health Choice, Veterans' Benefits) • Household income should be no more than 200% of the poverty level •The clinic cannot treat you if you are pregnant or think you are pregnant • Sexually transmitted diseases are not treated at the clinic.  ° ° °Dental Care: °Organization         Address  Phone  Notes  °[facility] [address], [place] (336) 641-6152 Accepts children up to age 21 who are enrolled in Medicaid or [place] Health Choice; pregnant women with a Medicaid card; and children who have  applied for Medicaid or [place] Health Choice, but were declined, whose parents can pay a reduced fee at time of service.  °[facility] [place]  [address], [place] (336) 641-7733 Accepts children up to age 21 who are enrolled in Medicaid or [place] Health Choice; pregnant women with a Medicaid card; and children who have applied for Medicaid or [place] Health Choice, but were declined, whose parents can pay a reduced fee at time of service.  °[facility] ° [address], [place] (336) 641-4533 Patients are seen by appointment only. Walk-ins are not accepted. [facility] will see patients 18 years of age and older. °Monday - Tuesday (8am-5pm) °Most Wednesdays (8:30-5pm) °$30 per visit, cash only  °[facility] ° [address], [place] (336) 641-4533 Patients are seen by appointment only. Walk-ins are not accepted. [facility] will see patients 18 years of age and older. °One Wednesday Evening (Monthly: Volunteer Based).  $30 per visit, cash only  °[facility]  (919) 537-3737 for adults; Children under age 4, call Graduate Pediatric Dentistry at (919) [phone]. Children aged 4-14, please call (919) [phone] to request a pediatric application. ° Dental services are provided in all areas of dental care including fillings, crowns and bridges, complete and partial dentures, implants, gum treatment, root canals, and extractions. Preventive care is also provided. Treatment is provided to both adults and children. °Patients are selected via a lottery and there is often a waiting list. °  °[facility] [address], °[place] ° (336) 763-8833 [email or web address] °  °[facility] [address], [place], [place] (336)723-1848, Ext. 123 Second and Fourth Thursday of each month, opens at 6:30 AM; Clinic ends at 9 AM.  Patients are seen on a first-come first-served basis, and a  limited number are seen during each clinic.  ° °[facility] ° [address], [place], [place] (336) 723-7904   Eligibility Requirements °You must have lived in [place], [place], or [place] counties for at least the last three months. °  You cannot be eligible for state or federal sponsored healthcare insurance, including Veterans Administration, Medicaid, or Medicare. °  You generally cannot be eligible for healthcare insurance through your employer.  °  How to apply: °Eligibility screenings are held every Tuesday and Wednesday   afternoon from 1:00 pm until 4:00 pm. You do not need an appointment for the interview!  °[facility] [address], [place], [place] 336-631-2330   °[facility]  336-342-8273   °[facility]  336-703-3100   °[facility]  336-570-6415   ° °Behavioral Health Resources in the Community: °Intensive Outpatient Programs °Organization         Address  Phone  Notes  °[facility] [address], [place], [place] 336-878-6098   °[facility] [address], [place], [place] 336-832-9800   °[facility] [address], [place], [place] ° 336-882-2125   °[facility] [address],  °[place], [place] 1-800-853-5163 or 336-641-4981   °Substance Abuse Resources °Organization         Address  Phone  Notes  °[facility]  336-882-2125   °[facility]  336-784-9470   °[facility]  336-285-9073   °[facility]  336-845-3988   °Residential & Outpatient Substance Abuse Program  1-800-659-3381   °Psychological Services °Organization         Address  Phone  Notes  ° Health  336- 832-9600   °[facility]  336- 378-7881   °[facility] [address], [place] 1-800-853-5163 or 336-641-4981   ° °Mobile Crisis Teams °Organization          Address  Phone  Notes  °[facility], Mobile Crisis Care Unit  1-877-626-1772   °Assertive °[facility] ° [address] [place], [place] 336-834-9664   °Sharon DeEsch [address] °[place] 336-554-5454   ° °Self-Help/Support Groups °Organization         Address  Phone             Notes  °[facility] - variety of support groups  336- 373-1402 Call for more information  °Narcotics Anonymous (NA), [facility] [address], °[place]  2 meetings at this location  ° °Residential Treatment Programs °Organization         Address  Phone  Notes  °[facility] [address],    °[place]  1-866-801-8205   °[facility] ° [address], [place], [place] 704-293-8524   °[facility] [address], [place] 336-845-3988 Admissions: 8am-3pm M-F  °[facility] [address]    °[place], [place] 336-841-1104   °[facility] [address], [place], [place] 336-379-7146   °[facility] [address]  °[place], [place] 336-285-9073   °[facility] Intensive Outpatient [address], [place], [place] 336-852-3033   °[facility] [address]  °[place], [place] 1-877-615-2722 or 336-784-9470   °[facility] [address] [place], [place] 336-227-7417 Accepts Medicaid  °[facility] [address]  °[place] 1-800-659-3381 Substance Abuse/Addiction Treatment  ° °[place] Behavioral Health Resources °Organization         Address  Phone  Notes  °[facility]  (888) 581-9988   °Julie Brannon, PhD [address] [place], [place]   (336) 349-5553 or (336) 951-0000   °[facility]   [address] °[place], [place] (336) 349-4454   °[facility] [address], [place],  (336) 342-8316 Insurance/Medicaid/sponsorship  through [facility]  °[facility] [address]                                    [place], [place] (336) 342-8316 Therapy/tele-psych/case  °[facility] [address]  ° [place], [place] (336) 349-2233    °Dr. Arfeen  (336) 349-4544   °[facility] [place]  [employer] [place] [facility] 1) [address], [place] °2) [address], [place] °3)  [address], [place] (336) 349-3220 °(336) 342-7768 ° °(336) 342-8140   °[place] [facility] (336) 342-1394 or (336) 342-3537 (After Hours)    ° ° °

## 2015-08-05 NOTE — ED Provider Notes (Signed)
CSN: [id number]     Arrival date & time 08/05/15  4098 History   First MD Initiated Contact with Patient 08/05/15 1022     Chief Complaint  Patient presents with  . Cough    productive cough x2 weeks     (Consider location/radiation/quality/duration/timing/severity/associated sxs/prior Treatment) HPI  64 year old male with a reported history of no past medical problems presents with a cough for about one week. Over the last 24 hours has had some mildly productive sputum, states it is white. Denies chest pain or shortness of breath. Mild nasal rhinorrhea. No fevers or chills. No headache or sore throat. No ear symptoms. Patient states that there is no shortness breath even with exertion. Patient has tried a liquid cough syrup that he does not know the name of that has not helped. Tried NyQuil before that. Is not on any home medicines and has never been a smoker. Does not have a primary care physician. Has not noticed any leg swelling or leg pain.  No past medical history on file. Past Surgical History  Procedure Laterality Date  . Hernia repair     No family history on file. Social History  Substance Use Topics  . Smoking status: Never Smoker   . Smokeless tobacco: Never Used  . Alcohol Use: Yes    Review of Systems  Constitutional: Negative for fever.  HENT: Positive for rhinorrhea.   Respiratory: Positive for cough. Negative for shortness of breath.   Cardiovascular: Negative for chest pain and leg swelling.  All other systems reviewed and are negative.     Allergies  Review of patient's allergies indicates no known allergies.  Home Medications   Prior to Admission medications   Medication Sig Start Date End Date Taking? Authorizing Provider  Dextromethorphan HBr (COUGH RELIEF) 15 MG/5ML LIQD Take 30 mLs by mouth once.   Yes Historical Provider, MD  ibuprofen (ADVIL,MOTRIN) 200 MG tablet Take 600 mg by mouth every 6 (six) hours as needed for moderate pain.   Yes  Historical Provider, MD  benzonatate (TESSALON PERLES) 100 MG capsule Take 1 capsule (100 mg total) by mouth 3 (three) times daily as needed for cough. 08/05/15   Pricilla Loveless, MD   BP 144/79 mmHg  Pulse 76  Temp(Src) 98.2 F (36.8 C) (Oral)  Resp 18  Ht  (1.575 m)  Wt 150 lb (68.04 kg)  BMI 27.43 kg/m2  SpO2 98% Physical Exam  Constitutional: He is oriented to person, place, and time. He appears well-developed and well-nourished. No distress.  HENT:  Head: Normocephalic and atraumatic.  Right Ear: External ear normal.  Left Ear: External ear normal.  Nose: Nose normal.  Eyes: Right eye exhibits no discharge. Left eye exhibits no discharge.  Neck: Neck supple.  Cardiovascular: Normal rate, regular rhythm, normal heart sounds and intact distal pulses.   Pulmonary/Chest: Effort normal and breath sounds normal. He has no wheezes. He has no rales.  Abdominal: Soft. There is no tenderness.  Musculoskeletal: He exhibits no edema.  Neurological: He is alert and oriented to person, place, and time.  Skin: Skin is warm and dry. He is not diaphoretic.  Nursing note and vitals reviewed.   ED Course  Procedures (including critical care time) Labs Review Labs Reviewed - No data to display  Imaging Review Dg Chest 2 View  08/05/2015   CLINICAL DATA:  Cough.  Intermittent cough.  Initial encounter.  EXAM: CHEST  2 VIEW  COMPARISON:  06/29/2010.  FINDINGS: Cardiopericardial silhouette within  normal limits. Mediastinal contours normal. Trachea midline. No airspace disease or effusion.  IMPRESSION: No active cardiopulmonary disease.   Electronically Signed   By: Andreas Newport M.D.   On: 08/05/2015 10:17   I have personally reviewed and evaluated these images and lab results as part of my medical decision-making.   EKG Interpretation None      MDM   Final diagnoses:  Cough    Patient is well-appearing with a mild cough. No smoking history or signs of COPD or bronchospasm.  Vital signs are unremarkable besides mild hypertension. Patient is not on any medicines would typically cause cough. Will start on a antitussives medicine and referred to a PCP and stressed the importance of following up with a PCP. Given his vitals are unremarkable do not feel lab testing would be beneficial and he has no symptoms of CHF. Stable for discharge home with return precautions.    Pricilla Loveless, MD 08/05/15 [phone]

## 2015-08-05 NOTE — ED Notes (Signed)
Patient reports productive cough x 2 weeks (white to green), no fever or chills. Denies SOB, hx of lung disease.

## 2016-12-11 ENCOUNTER — Encounter (HOSPITAL_COMMUNITY): Payer: Self-pay | Admitting: Oncology

## 2016-12-11 ENCOUNTER — Emergency Department (HOSPITAL_COMMUNITY)
Admission: EM | Admit: 2016-12-11 | Discharge: 2016-12-11 | Disposition: A | Discharge status: Home / Self Care | Payer: Medicare Other | Attending: Emergency Medicine | Admitting: Emergency Medicine

## 2016-12-11 ENCOUNTER — Emergency Department (HOSPITAL_COMMUNITY): Payer: Medicare Other

## 2016-12-11 DIAGNOSIS — R05 Cough: Secondary | ICD-10-CM | POA: Insufficient documentation

## 2016-12-11 DIAGNOSIS — I10 Essential (primary) hypertension: Secondary | ICD-10-CM | POA: Insufficient documentation

## 2016-12-11 DIAGNOSIS — R059 Cough, unspecified: Secondary | ICD-10-CM

## 2016-12-11 MED ORDER — BENZONATATE 100 MG PO CAPS: 100.0000 mg | ORAL_CAPSULE | Freq: Three times a day (TID) | ORAL | 0 refills | Status: DC

## 2016-12-11 MED ORDER — ALBUTEROL SULFATE HFA 108 (90 BASE) MCG/ACT IN AERS: 2.0000 | INHALATION_SPRAY | RESPIRATORY_TRACT | 0 refills | Status: AC | PRN

## 2016-12-11 MED ORDER — ALBUTEROL SULFATE HFA 108 (90 BASE) MCG/ACT IN AERS
2.0000 | INHALATION_SPRAY | Freq: Once | RESPIRATORY_TRACT | Status: AC
  Administered 2016-12-11: 2 via RESPIRATORY_TRACT
  Filled 2016-12-11: qty 6.7

## 2016-12-11 NOTE — ED Triage Notes (Signed)
Pt c/o cough x 2 weeks.  Denies any other sx.  Lung sounds are clear b/l, diminished at bases.  Reports white sputum.  Pt states, "I would like to check about the flu and pneumonia.

## 2016-12-11 NOTE — ED Notes (Signed)
Pt ambulatory and independent at discharge.  Verbalized understanding of discharge instructions 

## 2016-12-11 NOTE — ED Provider Notes (Signed)
Emergency Department Provider Note   I have reviewed the triage vital signs and the nursing notes.   HISTORY  Chief Complaint Cough   HPI Donald Kelly is a 66 y.o. male with PMH of HLD, GERD, and HTN presents to the emergency room in for evaluation of cough for the past 2 weeks. Patient reports a slightly productive cough consisting of white sputum. He denies any associated chest pain. Symptoms have been persistent and not worsening significantly. When they do not resolve spontaneously he became concerned and presented to the emergency department. He tried Nyquil which did not work. He has a mild sore throat. No HA. No abdominal pain.    History reviewed. No pertinent past medical history.  Patient Active Problem List   Diagnosis Date Noted  . HYPERLIPIDEMIA 06/27/2008  . ANEURYSM OF OTHER SPECIFIED ARTERY 06/27/2008  . GERD 06/27/2008  . DIZZINESS 06/27/2008  . ELEVATED BLOOD PRESSURE 06/27/2008    Past Surgical History:  Procedure Laterality Date  . FRACTURE SURGERY     right leg fx  . HERNIA REPAIR      Current Outpatient Rx  . Order #: [id number] Class: Historical Med  . Order #: [id number] Class: Historical Med  . Order #: [id number] Class: Print  . Order #: [id number] Class: Print  . Order #: [id number] Class: Print    Allergies Patient has no known allergies.  No family history on file.  Social History Social History  Substance Use Topics  . Smoking status: Never Smoker  . Smokeless tobacco: Never Used  . Alcohol use Yes    Review of Systems  Constitutional: No fever/chills Eyes: No visual changes. ENT: No sore throat. Cardiovascular: Denies chest pain. Respiratory: Denies shortness of breath. Positive mildly productive cough.  Gastrointestinal: No abdominal pain.  No nausea, no vomiting.  No diarrhea.  No constipation. Genitourinary: Negative for dysuria. Musculoskeletal: Negative for back pain. Skin: Negative for rash. Neurological: Negative for  headaches, focal weakness or numbness.  10-point ROS otherwise negative.  ____________________________________________   PHYSICAL EXAM:  VITAL SIGNS: ED Triage Vitals  Enc Vitals Group     BP 12/11/16 1944 157/93     Pulse Rate 12/11/16 1944 94     Resp 12/11/16 1944 16     Temp 12/11/16 1944 99.1 F (37.3 C)     Temp Source 12/11/16 1944 Oral     SpO2 12/11/16 1944 100 %     Weight 12/11/16 1944 133 lb (60.3 kg)     Height 12/11/16 1944 5\' 3"  (1.6 m)     Pain Score 12/11/16 1951 0   Constitutional: Alert and oriented. Well appearing and in no acute distress. Eyes: Conjunctivae are normal. Head: Atraumatic. Nose: No congestion/rhinnorhea. Mouth/Throat: Mucous membranes are moist.  Oropharynx non-erythematous. Neck: No stridor.   Cardiovascular: Normal rate, regular rhythm. Good peripheral circulation. Grossly normal heart sounds.   Respiratory: Normal respiratory effort.  No retractions. Lungs CTAB. Gastrointestinal: Soft and nontender. No distention.  Musculoskeletal: No lower extremity tenderness nor edema. No gross deformities of extremities. Neurologic:  Normal speech and language. No gross focal neurologic deficits are appreciated.  Skin:  Skin is warm, dry and intact. No rash noted.  ____________________________________________   LABS (all labs ordered are listed, but only abnormal results are displayed)  None ____________________________________________  RADIOLOGY  Dg Chest 2 View  Result Date: 12/11/2016 CLINICAL DATA:  Productive cough for 2 weeks EXAM: CHEST  2 VIEW COMPARISON:  08/05/2015 FINDINGS: Cardiac shadow is within normal limits. The lungs are  well aerated bilaterally. No focal infiltrate or sizable effusion is seen. Vague density is noted overlying the right lung base not well seen on the prior exam. This may be projectional in nature as it is not well appreciated on the lateral film. Short-term follow-up is recommended to assess for stability. Mild  degenerative changes of the thoracic spine are noted. IMPRESSION: Vague nodular density overlying the right lung base. Short-term follow-up examination is recommended. Electronically Signed   By: Alcide CleverMark  Lukens M.D.   On: 12/11/2016 20:30    ____________________________________________   PROCEDURES  Procedure(s) performed:   Procedures  None ____________________________________________   INITIAL IMPRESSION / ASSESSMENT AND PLAN / ED COURSE  Pertinent labs & imaging results that were available during my care of the patient were reviewed by me and considered in my medical decision making (see chart for details).  Patient resents to the emergency department for evaluation of persistent cough for the past 2 weeks. No respiratory distress. No hypoxemia. The patient has a small nodularity in the right lower lobe which I discussed with him. No evidence of pneumonia. Presentation is not clinically consistent with flu. He will establish care with a primary care provider and repeat chest x-ray in the next 2-3 weeks. Provided written reminder of this finding and plan at discharge.    At this time, I do not feel there is any life-threatening condition present. I have reviewed and discussed all results (EKG, imaging, lab, urine as appropriate), exam findings with patient. I have reviewed nursing notes and appropriate previous records.  I feel the patient is safe to be discharged home without further emergent workup. Discussed usual and customary return precautions. Patient and family (if present) verbalize understanding and are comfortable with this plan.  Patient will follow-up with their primary care provider. If they do not have a primary care provider, information for follow-up has been provided to them. All questions have been answered.  ____________________________________________  FINAL CLINICAL IMPRESSION(S) / ED DIAGNOSES  Final diagnoses:  Cough     MEDICATIONS GIVEN DURING THIS  VISIT:  Medications  albuterol (PROVENTIL HFA;VENTOLIN HFA) 108 (90 Base) MCG/ACT inhaler 2 puff (2 puffs Inhalation Given 12/11/16 2223)     NEW OUTPATIENT MEDICATIONS STARTED DURING THIS VISIT:  Discharge Medication List as of 12/11/2016 10:07 PM    START taking these medications   Details  albuterol (PROVENTIL HFA;VENTOLIN HFA) 108 (90 Base) MCG/ACT inhaler Inhale 2 puffs into the lungs every 4 (four) hours as needed for wheezing or shortness of breath., Starting Sat 12/11/2016, Print    !! benzonatate (TESSALON) 100 MG capsule Take 1 capsule (100 mg total) by mouth every 8 (eight) hours., Starting Sat 12/11/2016, Print     !! - Potential duplicate medications found. Please discuss with provider.        Note:  This document was prepared using Dragon voice recognition software and may include unintentional dictation errors.  Alona BeneJoshua Cinde Ebert, MD Emergency Medicine   Maia PlanJoshua G Dorthey Depace, MD 12/12/16 [phone]

## 2016-12-11 NOTE — Discharge Instructions (Signed)
We believe your persistent cough is a result of a viral syndrome for which your symptoms have still not quite resolved.  Sometimes it takes many weeks to completely go away, especially if you smoke or have chronic lung problems.  Please take any medications prescribed and follow up as recommended with your regular doctor.  We did find a small nodule on your chest x-ray today. This may be nothing but to be sure you will need a repeat chest -ray in 2-3 weeks to follow up the nodule. You can have this done through your PCP.   If you develop any new or worsening symptoms, including but not limited to fever, persistent vomiting, worsening shortness of breath, or other symptoms that concern you, please return to the Emergency Department immediately.

## 2017-12-07 ENCOUNTER — Encounter (HOSPITAL_COMMUNITY): Payer: Self-pay

## 2017-12-07 ENCOUNTER — Other Ambulatory Visit: Payer: Self-pay

## 2017-12-07 ENCOUNTER — Emergency Department (HOSPITAL_COMMUNITY)
Admission: EM | Admit: 2017-12-07 | Discharge: 2017-12-08 | Disposition: A | Discharge status: Home / Self Care | Payer: Medicare Other | Attending: Emergency Medicine | Admitting: Emergency Medicine

## 2017-12-07 ENCOUNTER — Emergency Department (HOSPITAL_COMMUNITY): Payer: Medicare Other

## 2017-12-07 DIAGNOSIS — R12 Heartburn: Secondary | ICD-10-CM | POA: Diagnosis not present

## 2017-12-07 DIAGNOSIS — R1011 Right upper quadrant pain: Secondary | ICD-10-CM | POA: Diagnosis present

## 2017-12-07 LAB — CBC
HEMATOCRIT: 42.8 % (ref 39.0–52.0)
Hemoglobin: 13.8 g/dL (ref 13.0–17.0)
MCH: 26.1 pg (ref 26.0–34.0)
MCHC: 32.2 g/dL (ref 30.0–36.0)
MCV: 80.9 fL (ref 78.0–100.0)
Platelets: 302 10*3/uL (ref 150–400)
RBC: 5.29 MIL/uL (ref 4.22–5.81)
RDW: 13.9 % (ref 11.5–15.5)
WBC: 7.4 10*3/uL (ref 4.0–10.5)

## 2017-12-07 LAB — HEPATIC FUNCTION PANEL
ALT: 19 U/L (ref 17–63)
AST: 19 U/L (ref 15–41)
Albumin: 4.1 g/dL (ref 3.5–5.0)
Alkaline Phosphatase: 56 U/L (ref 38–126)
BILIRUBIN DIRECT: 0.1 mg/dL (ref 0.1–0.5)
BILIRUBIN INDIRECT: 1.2 mg/dL — AB (ref 0.3–0.9)
TOTAL PROTEIN: 7.9 g/dL (ref 6.5–8.1)
Total Bilirubin: 1.3 mg/dL — ABNORMAL HIGH (ref 0.3–1.2)

## 2017-12-07 LAB — BASIC METABOLIC PANEL
Anion gap: 7 (ref 5–15)
BUN: 7 mg/dL (ref 6–20)
CO2: 26 mmol/L (ref 22–32)
Calcium: 9.6 mg/dL (ref 8.9–10.3)
Chloride: 105 mmol/L (ref 101–111)
Creatinine, Ser: 0.82 mg/dL (ref 0.61–1.24)
GLUCOSE: 103 mg/dL — AB (ref 65–99)
POTASSIUM: 3.6 mmol/L (ref 3.5–5.1)
Sodium: 138 mmol/L (ref 135–145)

## 2017-12-07 LAB — I-STAT TROPONIN, ED
Troponin i, poc: 0 ng/mL (ref 0.00–0.08)
Troponin i, poc: 0 ng/mL (ref 0.00–0.08)

## 2017-12-07 LAB — LIPASE, BLOOD: LIPASE: 32 U/L (ref 11–51)

## 2017-12-07 MED ORDER — PANTOPRAZOLE SODIUM 40 MG PO TBEC: 40.0000 mg | DELAYED_RELEASE_TABLET | Freq: Every day | ORAL | 0 refills | Status: AC

## 2017-12-07 MED ORDER — PANTOPRAZOLE SODIUM 40 MG PO TBEC
40.0000 mg | DELAYED_RELEASE_TABLET | Freq: Once | ORAL | Status: AC
  Administered 2017-12-08: 40 mg via ORAL
  Filled 2017-12-07: qty 1

## 2017-12-07 NOTE — ED Triage Notes (Signed)
Pt c/o intermittent central chest/upper abdominal pain x 4 months and difficulty "belching."  Pain score 7/10.  Pt reports pain beings w/ eating.

## 2017-12-07 NOTE — ED Provider Notes (Signed)
[facility]-EMERGENCY DEPT Provider Note   CSN: [id number] Arrival date & time: 12/07/17  1413     History   Chief Complaint Chief Complaint  Patient presents with  . Heartburn    HPI Donald Kelly is a 67 y.o. male.  The history is provided by the patient.  Heartburn   He comes in complaining of episodes of heartburn which have been occurring for the last several weeks to several months (he varies the timeframe on different episodes of questioning).  Heartburn will come on after eating fried foods and all last for a few minutes before resolving.  Discomfort is in the lower sternal area.  He denies radiation of the discomfort.  There is no associated dyspnea, nausea, diaphoresis.  There is no exertional component.  There is no positional component.  He has taken Tums without relief.  Symptoms have been stable over time.  He denies tobacco use and he denies history of hypertension or diabetes or hyperlipidemia.  He denies family history of premature coronary atherosclerosis.  History reviewed. No pertinent past medical history.  Patient Active Problem List   Diagnosis Date Noted  . HYPERLIPIDEMIA 06/27/2008  . ANEURYSM OF OTHER SPECIFIED ARTERY 06/27/2008  . GERD 06/27/2008  . DIZZINESS 06/27/2008  . ELEVATED BLOOD PRESSURE 06/27/2008    Past Surgical History:  Procedure Laterality Date  . FRACTURE SURGERY     right leg fx  . HERNIA REPAIR         Home Medications    Prior to Admission medications   Medication Sig Start Date End Date Taking? Authorizing Provider  albuterol (PROVENTIL HFA;VENTOLIN HFA) 108 (90 Base) MCG/ACT inhaler Inhale 2 puffs into the lungs every 4 (four) hours as needed for wheezing or shortness of breath. 12/11/16   Long, Arlyss Repress, MD  benzonatate (TESSALON PERLES) 100 MG capsule Take 1 capsule (100 mg total) by mouth 3 (three) times daily as needed for cough. Patient not taking: Reported on 12/11/2016 08/05/15   Pricilla Loveless,  MD  benzonatate (TESSALON) 100 MG capsule Take 1 capsule (100 mg total) by mouth every 8 (eight) hours. 12/11/16   Long, Arlyss Repress, MD  DM-Doxylamine-Acetaminophen (NYQUIL COLD & FLU PO) Take 30 mLs by mouth at bedtime as needed (cough).    [provider]  Phenylephrine-Pheniramine-DM [facility] COLD & COUGH PO) Take 30 mLs by mouth daily as needed (cold symptoms).    [provider]    Family History History reviewed. No pertinent family history.  Social History Social History   Tobacco Use  . Smoking status: Never Smoker  . Smokeless tobacco: Never Used  Substance Use Topics  . Alcohol use: Yes  . Drug use: No     Allergies   Patient has no known allergies.   Review of Systems Review of Systems  Gastrointestinal: Positive for heartburn.  All other systems reviewed and are negative.    Physical Exam Updated Vital Signs BP (!) 154/90 (BP Location: Right Arm)   Pulse 72   Temp 98.2 F (36.8 C) (Oral)   Resp 18   Ht 5\' 2"  (1.575 m)   Wt 61.2 kg (135 lb)   SpO2 98%   BMI 24.69 kg/m   Physical Exam  Nursing note and vitals reviewed.  67 year old male, resting comfortably and in no acute distress. Vital signs are significant for elevated blood pressure. Oxygen saturation is 98%, which is normal. Head is normocephalic and atraumatic. PERRLA, EOMI. Oropharynx is clear. Neck  is nontender and supple without adenopathy or JVD. Back is nontender and there is no CVA tenderness. Lungs are clear without rales, wheezes, or rhonchi. Chest is nontender. Heart has regular rate and rhythm without murmur. Abdomen is soft, flat, with mild to moderate right upper quadrant tenderness.  There is no rebound or guarding.  There is a negative Murphy sign.  There are no masses or hepatosplenomegaly and peristalsis is normoactive. Extremities have no cyanosis or edema, full range of motion is present. Skin is warm and dry without rash. Neurologic: Mental status is normal,  cranial nerves are intact, there are no motor or sensory deficits.  ED Treatments / Results  Labs (all labs ordered are listed, but only abnormal results are displayed) Labs Reviewed  BASIC METABOLIC PANEL - Abnormal; Notable for the following components:      Result Value   Glucose, Bld 103 (*)    All other components within normal limits  HEPATIC FUNCTION PANEL - Abnormal; Notable for the following components:   Total Bilirubin 1.3 (*)    Indirect Bilirubin 1.2 (*)    All other components within normal limits  CBC  LIPASE, BLOOD  I-STAT TROPONIN, ED  I-STAT TROPONIN, ED    EKG  EKG Interpretation  Date/Time:  Wednesday December 07 2017 16:20:32 EST Ventricular Rate:  72 PR Interval:    QRS Duration: 67 QT Interval:  364 QTC Calculation: 399 R Axis:   29 Text Interpretation:  Sinus rhythm Baseline wander in lead(s) II aVF Otherwise within normal limits When compared with ECG of 10/13/2013, No significant change was found Confirmed by Dione Booze ([id number]) on 12/07/2017 4:27:37 PM Also confirmed by Dione Booze ([id number]), editor [place], Tamera Punt [phone])  on 12/07/2017 4:43:33 PM       Radiology Dg Chest 2 View  Result Date: 12/07/2017 CLINICAL DATA:  Pt c/o intermittent central chest/upper abdominal pain x 4 months and difficulty "belching." EXAM: CHEST  2 VIEW COMPARISON:  12/11/2016 FINDINGS: Normal mediastinum and cardiac silhouette. Normal pulmonary vasculature. No evidence of effusion, infiltrate, or pneumothorax. No acute bony abnormality. IMPRESSION: No acute cardiopulmonary process. Electronically Signed   By: Genevive Bi M.D.   On: 12/07/2017 16:36   US Abdomen Limited  Result Date: 12/07/2017 CLINICAL DATA:  Right upper quadrant pain for 2 days EXAM: ULTRASOUND ABDOMEN LIMITED RIGHT UPPER QUADRANT COMPARISON:  None. FINDINGS: Gallbladder: No gallstones or wall thickening visualized. No sonographic Murphy sign noted by sonographer. Common bile duct: Diameter: Normal  caliber, 3 mm Liver: No focal lesion identified. Within normal limits in parenchymal echogenicity. Portal vein is patent on color Doppler imaging with normal direction of blood flow towards the liver. IMPRESSION: Normal right upper quadrant ultrasound. Electronically Signed   By: Charlett Nose M.D.   On: 12/07/2017 22:37    Procedures Procedures (including critical care time)  Medications Ordered in ED Medications  pantoprazole (PROTONIX) EC tablet 40 mg (not administered)     Initial Impression / Assessment and Plan / ED Course  I have reviewed the triage vital signs and the nursing notes.  Pertinent labs & imaging results that were available during my care of the patient were reviewed by me and considered in my medical decision making (see chart for details).  Epigastric and lower chest discomfort with normal ECG and normal initial labs including troponin.  Chest x-ray is normal.  Symptoms coming on with eating fried foods is suspicious for possible biliary tract disease.  Will send for right upper quadrant ultrasound.  Heart score is 2, which puts him at low risk for major adverse cardiac events in the next 30 days.  Old records are reviewed, and he has no relevant past visits.  Ultrasound is negative for cholelithiasis.  He is discharged with prescription for pantoprazole and is encouraged to find a primary care physician.  Given [facility] as a resource to try to establish primary care.  Final Clinical Impressions(s) / ED Diagnoses   Final diagnoses:  Heartburn symptom    ED Discharge Orders        Ordered    pantoprazole (PROTONIX) 40 MG tablet  Daily     12/07/17 2341       Dione BoozeGlick, Adante Courington, MD 12/07/17 2344

## 2017-12-07 NOTE — ED Notes (Signed)
Pt reports no N/V/D. Denies SOB with the pain and is currently pain free.

## 2017-12-07 NOTE — Discharge Instructions (Signed)
Please work on getting a primary care physician.

## 2017-12-08 NOTE — ED Notes (Signed)
Pt ambulatory and independent at discharge.  Verbalized understanding of discharge instructions 

## 2018-06-25 ENCOUNTER — Other Ambulatory Visit: Payer: Self-pay

## 2018-06-25 ENCOUNTER — Emergency Department (HOSPITAL_COMMUNITY): Payer: Medicare Other

## 2018-06-25 ENCOUNTER — Emergency Department (HOSPITAL_COMMUNITY)
Admission: EM | Admit: 2018-06-25 | Discharge: 2018-06-25 | Disposition: A | Discharge status: Home / Self Care | Payer: Medicare Other | Attending: Emergency Medicine | Admitting: Emergency Medicine

## 2018-06-25 ENCOUNTER — Encounter (HOSPITAL_COMMUNITY): Payer: Self-pay

## 2018-06-25 DIAGNOSIS — M25552 Pain in left hip: Secondary | ICD-10-CM | POA: Diagnosis not present

## 2018-06-25 DIAGNOSIS — Z79899 Other long term (current) drug therapy: Secondary | ICD-10-CM | POA: Diagnosis not present

## 2018-06-25 DIAGNOSIS — I10 Essential (primary) hypertension: Secondary | ICD-10-CM | POA: Diagnosis not present

## 2018-06-25 MED ORDER — IBUPROFEN 400 MG PO TABS: 400.0000 mg | ORAL_TABLET | Freq: Four times a day (QID) | ORAL | 0 refills | Status: AC | PRN

## 2018-06-25 MED ORDER — IBUPROFEN 200 MG PO TABS
400.0000 mg | ORAL_TABLET | Freq: Once | ORAL | Status: AC
  Administered 2018-06-25: 400 mg via ORAL
  Filled 2018-06-25: qty 2

## 2018-06-25 MED ORDER — ACETAMINOPHEN 500 MG PO TABS: 500.0000 mg | ORAL_TABLET | Freq: Four times a day (QID) | ORAL | 0 refills | Status: AC | PRN

## 2018-06-25 NOTE — ED Provider Notes (Signed)
[facility]-EMERGENCY DEPT Provider Note   CSN: [id number] Arrival date & time: 06/25/18  1235     History   Chief Complaint Chief Complaint  Patient presents with  . Hip Pain    HPI Donald Kelly is a 67 y.o. male with history of hypertension, hyperlipidemia and GERD who presents with a 1 to 2-day history of left hip pain.  Patient reports he has been lifting a lot of heavy crates at work.  He works at [employer].  He reports his symptoms are only really present when he walks.  He describes it as soreness on the outside separating the leg.  He denies any numbness or tingling.  He has not tried anything at home for his symptoms.  He reports having hip pain several years ago and received some type of injection that seemed to help his symptoms.  He does not know what kind of injection.  HPI  History reviewed. No pertinent past medical history.  Patient Active Problem List   Diagnosis Date Noted  . HYPERLIPIDEMIA 06/27/2008  . ANEURYSM OF OTHER SPECIFIED ARTERY 06/27/2008  . GERD 06/27/2008  . DIZZINESS 06/27/2008  . ELEVATED BLOOD PRESSURE 06/27/2008    Past Surgical History:  Procedure Laterality Date  . FRACTURE SURGERY     right leg fx  . HERNIA REPAIR          Home Medications    Prior to Admission medications   Medication Sig Start Date End Date Taking? Authorizing Provider  acetaminophen (TYLENOL) 500 MG tablet Take 1 tablet (500 mg total) by mouth every 6 (six) hours as needed. 06/25/18   Dyanara Cozza, Waylan BogaAlexandra M, PA-C  albuterol (PROVENTIL HFA;VENTOLIN HFA) 108 (90 Base) MCG/ACT inhaler Inhale 2 puffs into the lungs every 4 (four) hours as needed for wheezing or shortness of breath. 12/11/16   Long, Arlyss RepressJoshua G, MD  ibuprofen (ADVIL,MOTRIN) 400 MG tablet Take 1 tablet (400 mg total) by mouth every 6 (six) hours as needed. 06/25/18   Delynn Olvera, Waylan BogaAlexandra M, PA-C  pantoprazole (PROTONIX) 40 MG tablet Take 1 tablet (40 mg total) by mouth daily. 12/07/17   Dione BoozeGlick, David, MD     Family History No family history on file.  Social History Social History   Tobacco Use  . Smoking status: Never Smoker  . Smokeless tobacco: Never Used  Substance Use Topics  . Alcohol use: Yes  . Drug use: No     Allergies   Patient has no known allergies.   Review of Systems Review of Systems  Constitutional: Negative for fever.  Musculoskeletal: Positive for arthralgias.  Neurological: Negative for numbness.     Physical Exam Updated Vital Signs BP (!) 151/80 (BP Location: Left Arm)   Pulse 79   Temp 98.4 F (36.9 C) (Oral)   Resp 16   SpO2 100%   Physical Exam  Constitutional: He appears well-developed and well-nourished. No distress.  HENT:  Head: Normocephalic and atraumatic.  Mouth/Throat: Oropharynx is clear and moist. No oropharyngeal exudate.  Eyes: Pupils are equal, round, and reactive to light. Conjunctivae are normal. Right eye exhibits no discharge. Left eye exhibits no discharge. No scleral icterus.  Neck: Normal range of motion. Neck supple. No thyromegaly present.  Cardiovascular: Normal rate, regular rhythm, normal heart sounds and intact distal pulses. Exam reveals no gallop and no friction rub.  No murmur heard. Pulmonary/Chest: Effort normal and breath sounds normal. No stridor. No respiratory distress. He has no wheezes. He has no rales.  Musculoskeletal:  He exhibits no edema.  Full range of motion of the left hip, no significant tenderness; no skin changes noted to the left hip; no tenderness to the cervical, thoracic, or lumbar spine 5/5 strength and normal sensation to bilateral lower extremities  Lymphadenopathy:    He has no cervical adenopathy.  Neurological: He is alert. Coordination normal.  Skin: Skin is warm and dry. No rash noted. He is not diaphoretic. No pallor.  Psychiatric: He has a normal mood and affect.  Nursing note and vitals reviewed.    ED Treatments / Results  Labs (all labs ordered are listed, but only  abnormal results are displayed) Labs Reviewed - No data to display  EKG None  Radiology Dg Hip Unilat W Or Wo Pelvis 2-3 Views Left  Result Date: 06/25/2018 CLINICAL DATA:  Lateral left hip pain radiating to left leg today. EXAM: DG HIP (WITH OR WITHOUT PELVIS) 2-3V LEFT COMPARISON:  11/06/2012 FINDINGS: Mild symmetric degenerative change of the hips. No acute fracture or dislocation. Mild degenerate change of the spine. IMPRESSION: No acute findings. Electronically Signed   By: Elberta Fortisaniel  Boyle M.D.   On: 06/25/2018 14:53    Procedures Procedures (including critical care time)  Medications Ordered in ED Medications  ibuprofen (ADVIL,MOTRIN) tablet 400 mg (400 mg Oral Given 06/25/18 1433)     Initial Impression / Assessment and Plan / ED Course  I have reviewed the triage vital signs and the nursing notes.  Pertinent labs & imaging results that were available during my care of the patient were reviewed by me and considered in my medical decision making (see chart for details).     Patient with left hip pain after heavy lifting at work.  He also told us that Dr. Rodena MedinMessick that he remembered putting his legs up on a table resting for a while and this may explain his symptoms.  X-ray is negative.  No signs of septic joint.  We will treat with ibuprofen and Tylenol and ice.  Patient given a few days off work to rest and avoid heavy lifting.  Return precautions discussed.  Patient understands and agrees with plan.  Patient vitals stable throughout ED course and discharged in satisfactory condition.  Patient also evaluated by my attending, Dr. Rodena MedinMessick, who guided patient's management and agrees with plan.  Final Clinical Impressions(s) / ED Diagnoses   Final diagnoses:  Left hip pain    ED Discharge Orders         Ordered    ibuprofen (ADVIL,MOTRIN) 400 MG tablet  Every 6 hours PRN     06/25/18 1506    acetaminophen (TYLENOL) 500 MG tablet  Every 6 hours PRN     06/25/18 [id number]            Kerriann Kamphuis M, PA-C 06/25/18 1512    Wynetta FinesMessick, Peter C, MD 06/26/18 [phone]

## 2018-06-25 NOTE — Discharge Instructions (Addendum)
Use ice 3-4 times daily alternating 20 minutes on, 20 minutes off.  You can take ibuprofen and Tylenol as prescribed, as needed for your pain.  Please return to emergency department if you develop any new or worsening symptoms.  You can follow-up with your doctor if your symptoms are not improving.

## 2018-06-25 NOTE — ED Triage Notes (Signed)
He c/o non-traumatic left hip pain which radiates into his left leg. He is able ambulate with minimal limp and is in no distress.

## 2018-12-07 IMAGING — US US ABDOMEN LIMITED
1 series · 14 of 25 positions shown · non-contrast
Comparison: None.

CLINICAL DATA: Right upper quadrant pain for 2 days

EXAM:
ULTRASOUND ABDOMEN LIMITED RIGHT UPPER QUADRANT

[Series 1: us abdomen limited · 0.20mm/px · 14 of 53 slices shown]
[im 1/53]
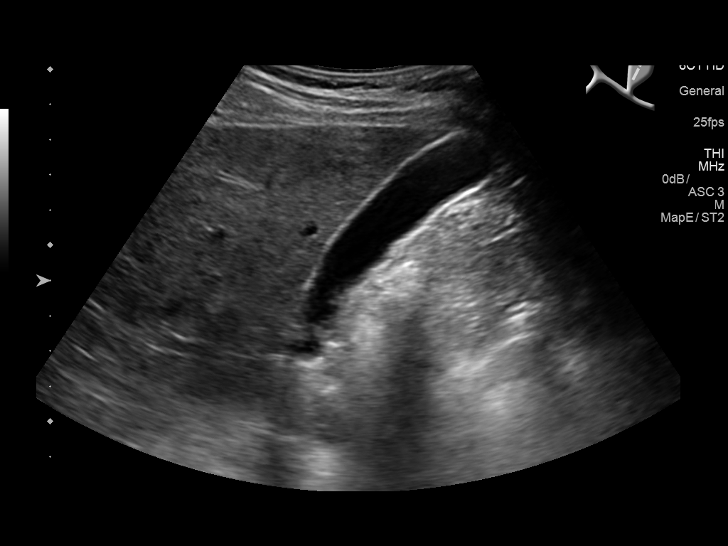
[im 5/53]
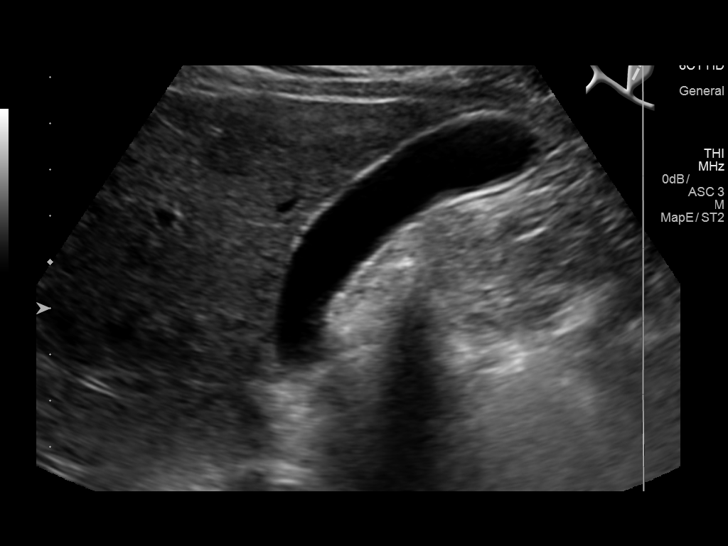
[im 9/53]
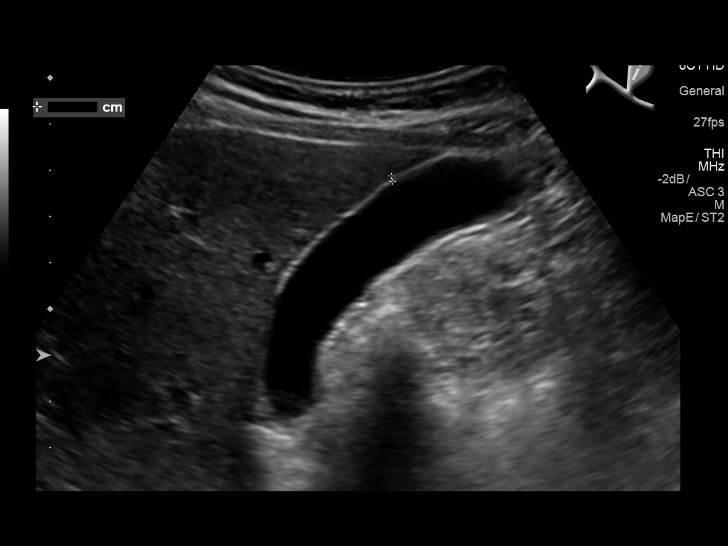
[im 14/53]
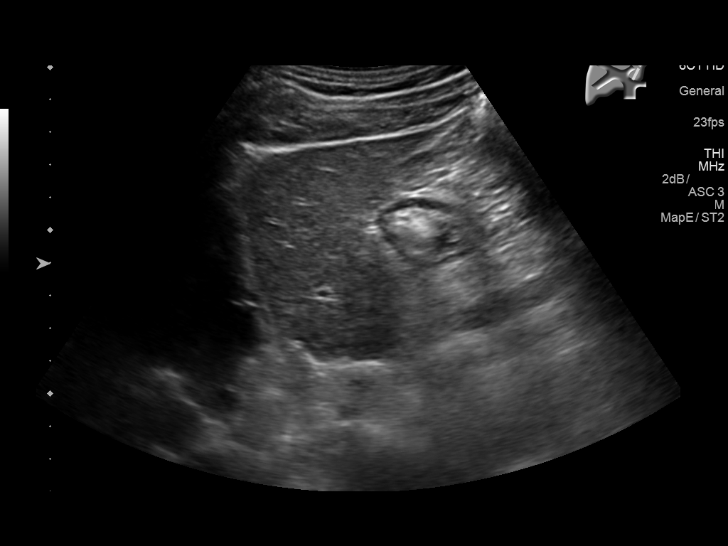
[im 18/53]
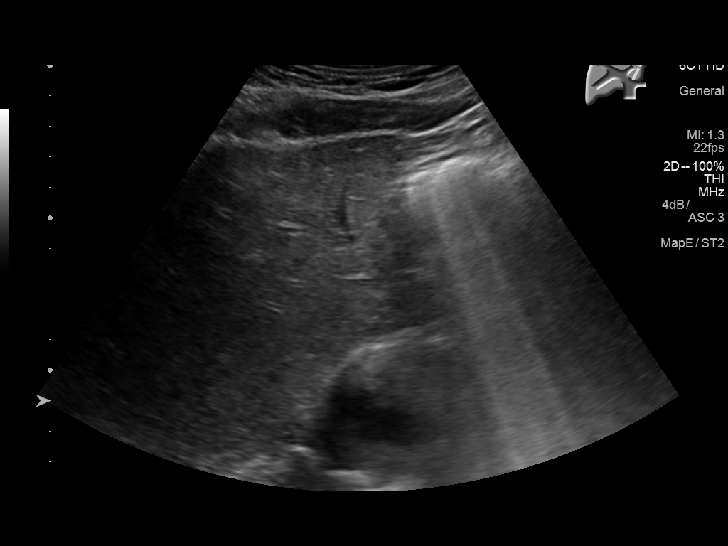
[im 20/53]
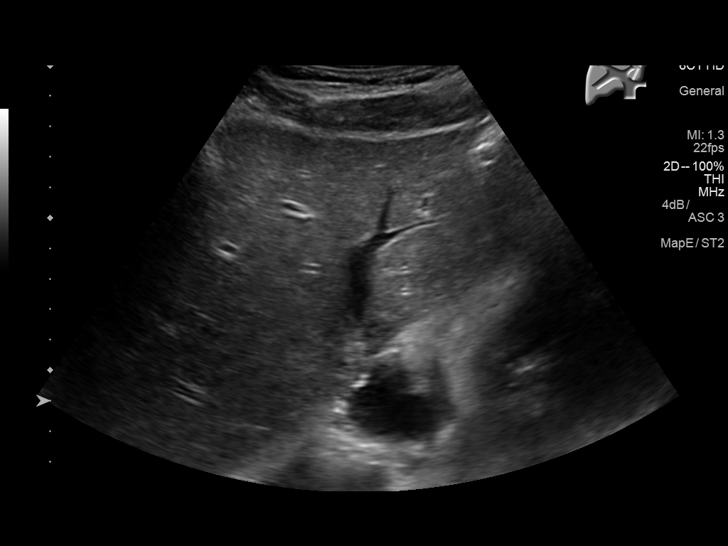
[im 24/53]
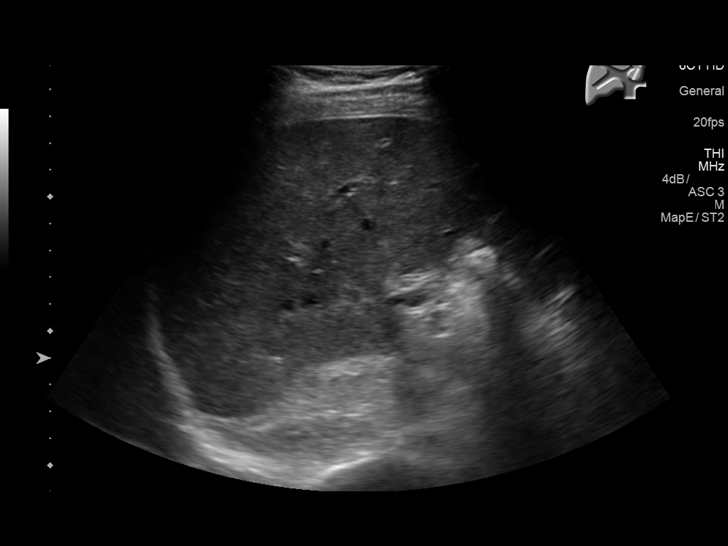
[im 29/53]
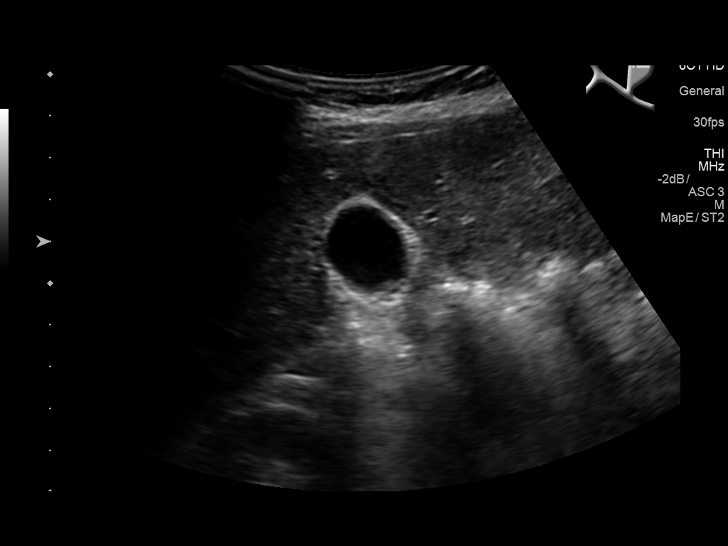
[im 33/53]
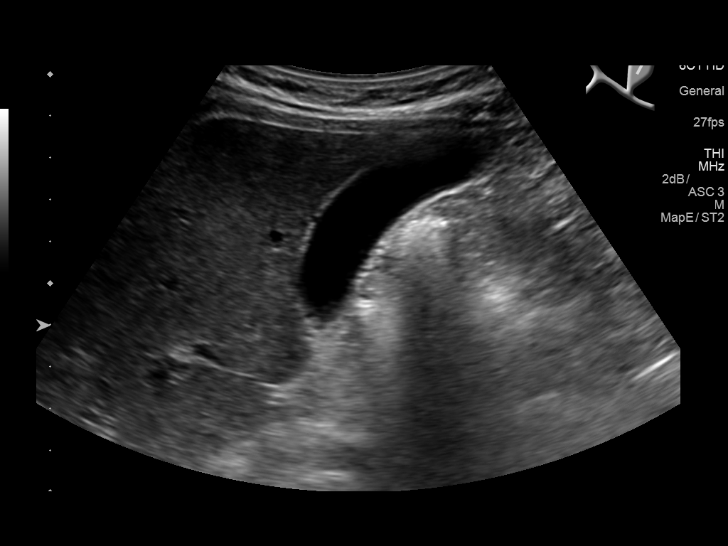
[im 35/53]
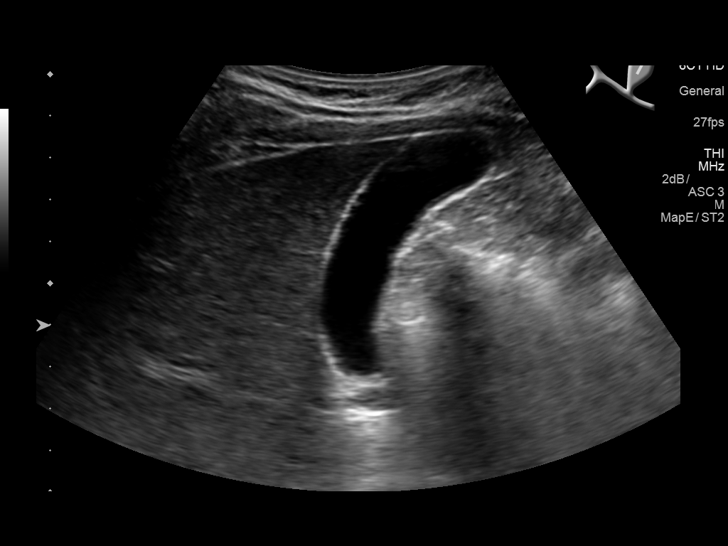
[im 40/53]
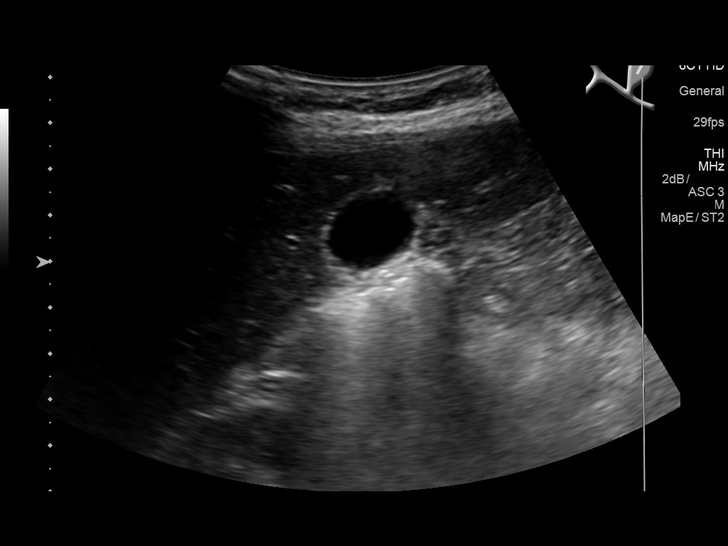
[im 44/53]
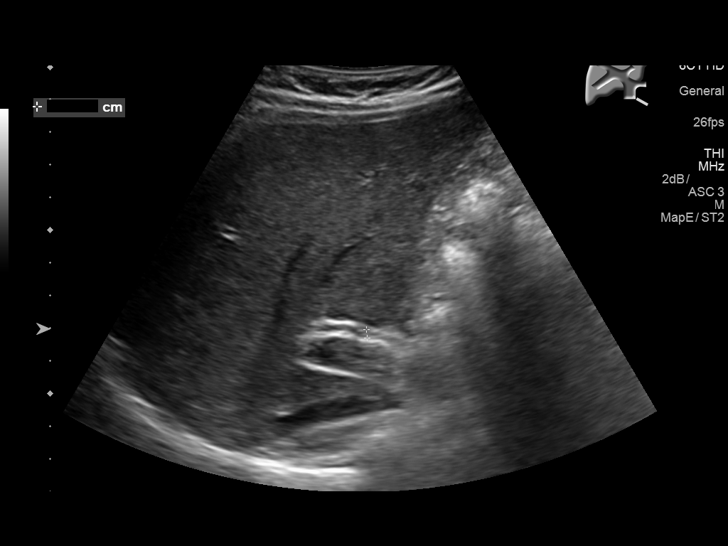
[im 48/53]
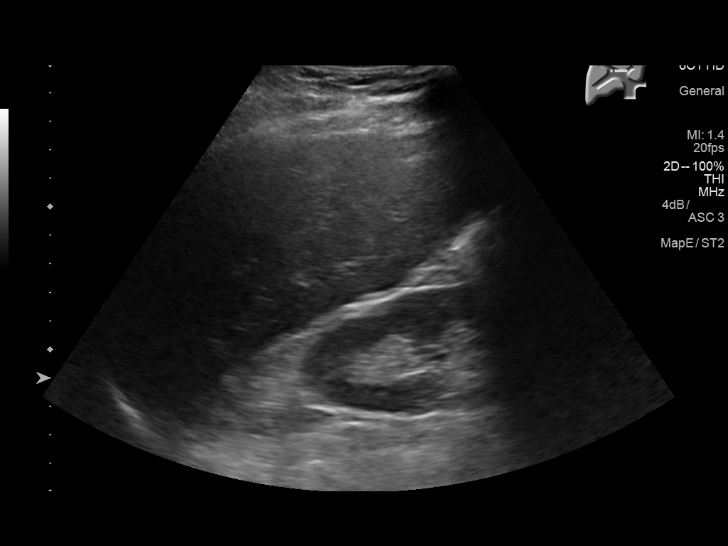
[im 53/53]
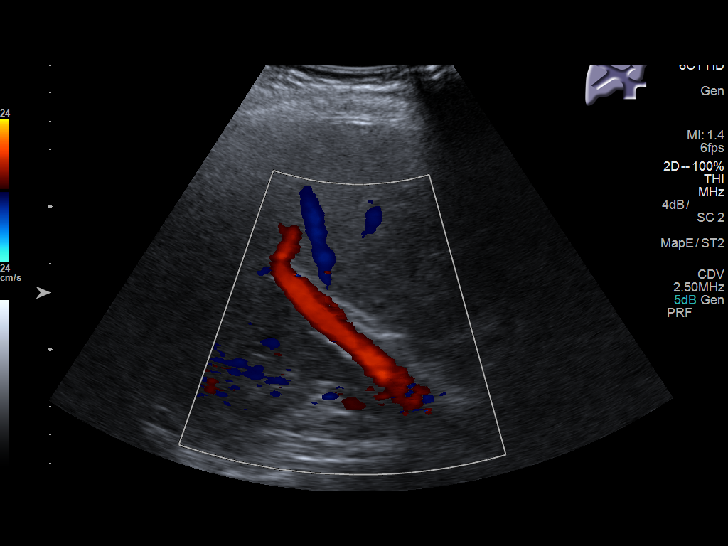

[14 of 25 positions shown; findings below may reference images not displayed]

FINDINGS: Gallbladder:

No gallstones or wall thickening visualized. No sonographic Murphy
sign noted by sonographer.

Common bile duct:

Diameter: Normal caliber, 3 mm

Liver:

No focal lesion identified. Within normal limits in parenchymal
echogenicity. Portal vein is patent on color Doppler imaging with
normal direction of blood flow towards the liver.
IMPRESSION: Normal right upper quadrant ultrasound.

## 2019-12-10 ENCOUNTER — Telehealth (INDEPENDENT_AMBULATORY_CARE_PROVIDER_SITE_OTHER): Payer: Self-pay

## 2019-12-13 NOTE — Telephone Encounter (Signed)
Appt was verified.

## 2020-01-18 ENCOUNTER — Ambulatory Visit: Payer: Medicare Other | Attending: Internal Medicine

## 2020-02-21 ENCOUNTER — Ambulatory Visit (INDEPENDENT_AMBULATORY_CARE_PROVIDER_SITE_OTHER): Payer: Medicare Other | Admitting: Nurse Practitioner

## 2020-05-19 IMAGING — CR DG HIP (WITH OR WITHOUT PELVIS) 2-3V*L*
3 series · 3 of 3 positions shown · non-contrast
Comparison: 11/06/2012

CLINICAL DATA: Lateral left hip pain radiating to left leg today.

EXAM:
DG HIP (WITH OR WITHOUT PELVIS) 2-3V LEFT

[t pelvis ap]
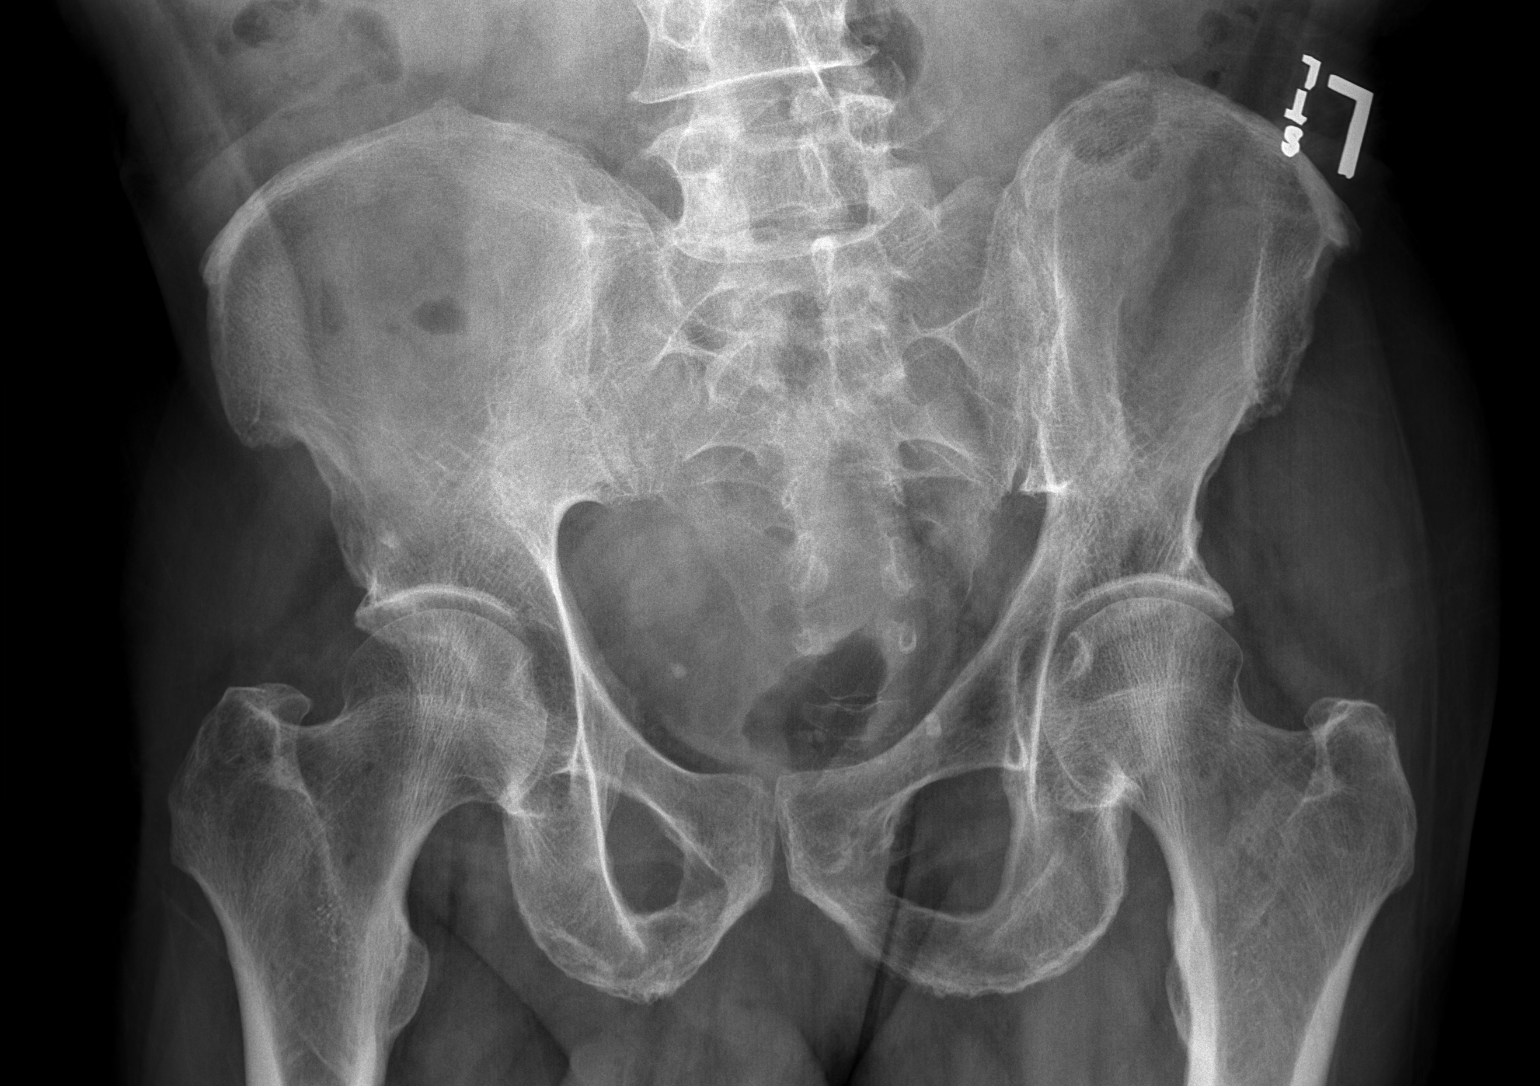

[t hip ap left]
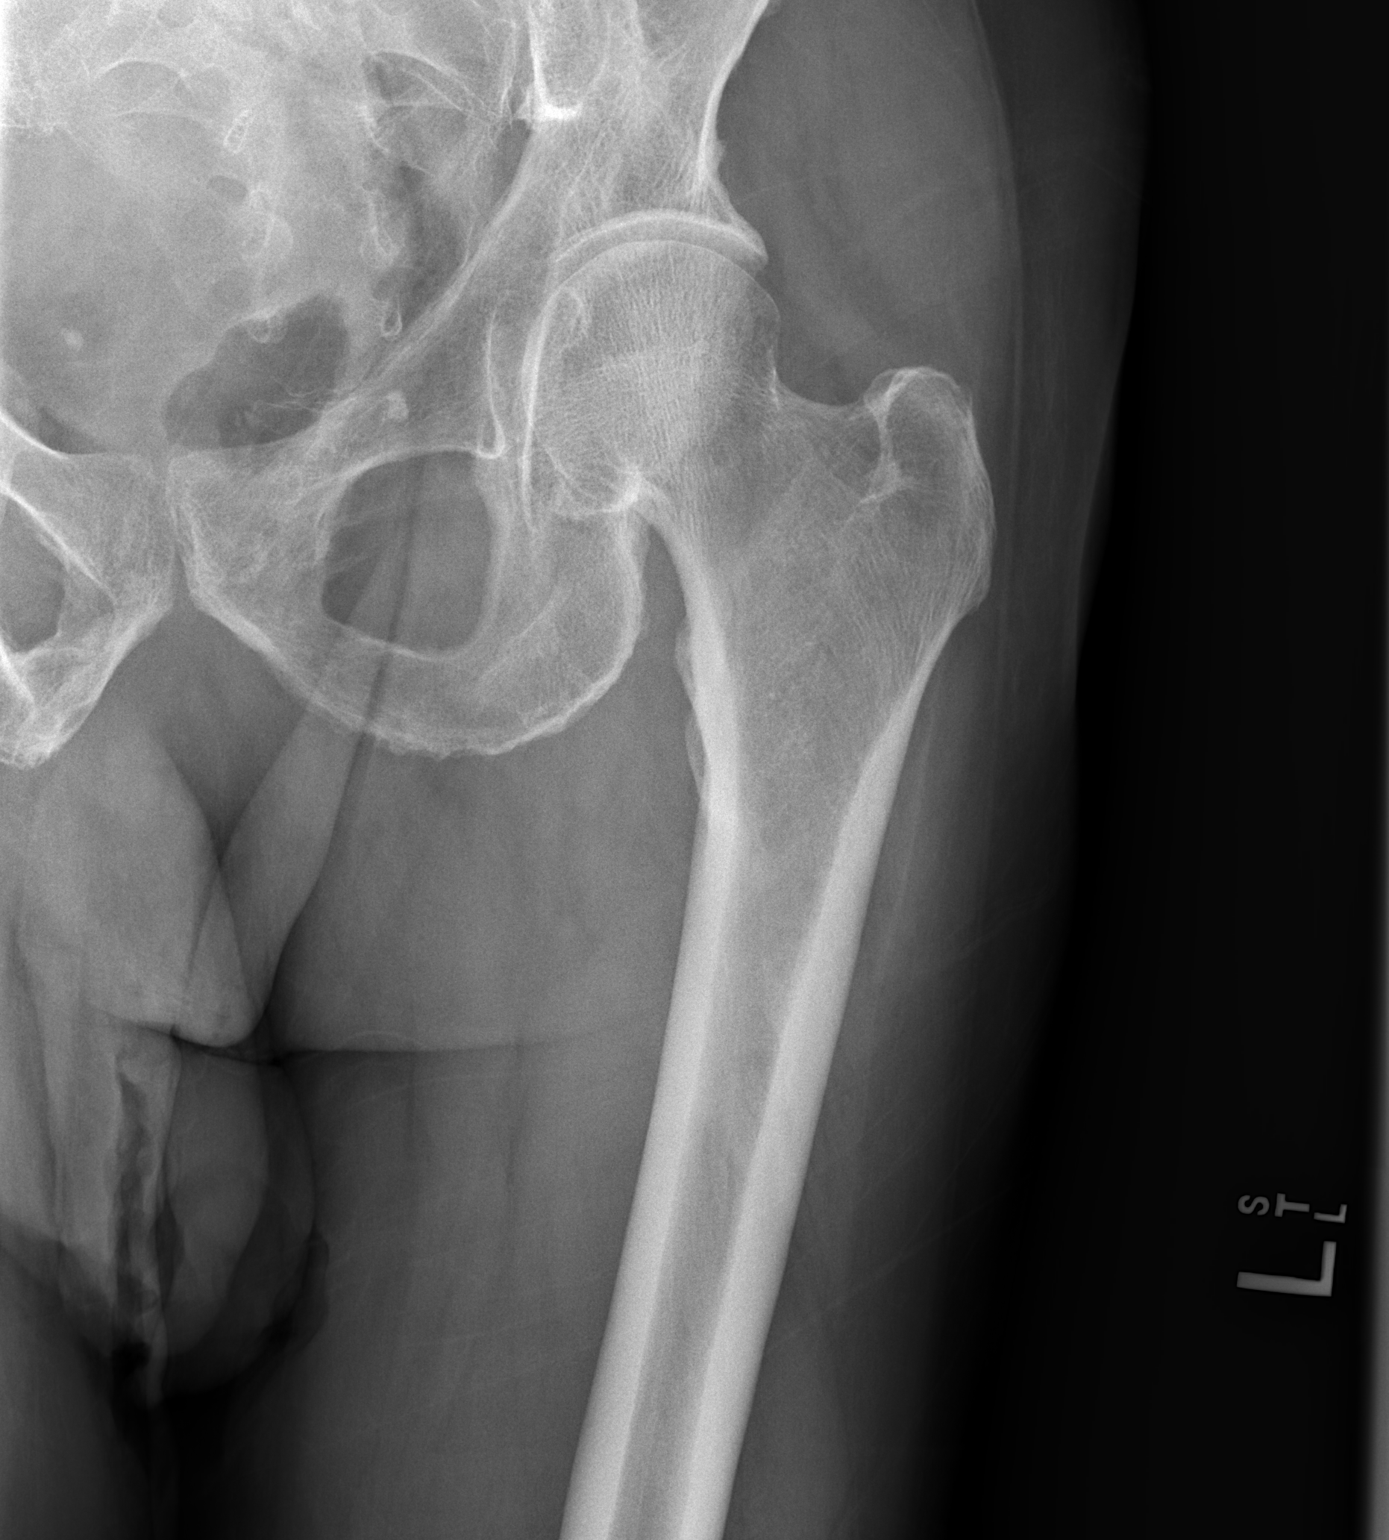

[t hip frog leg left]
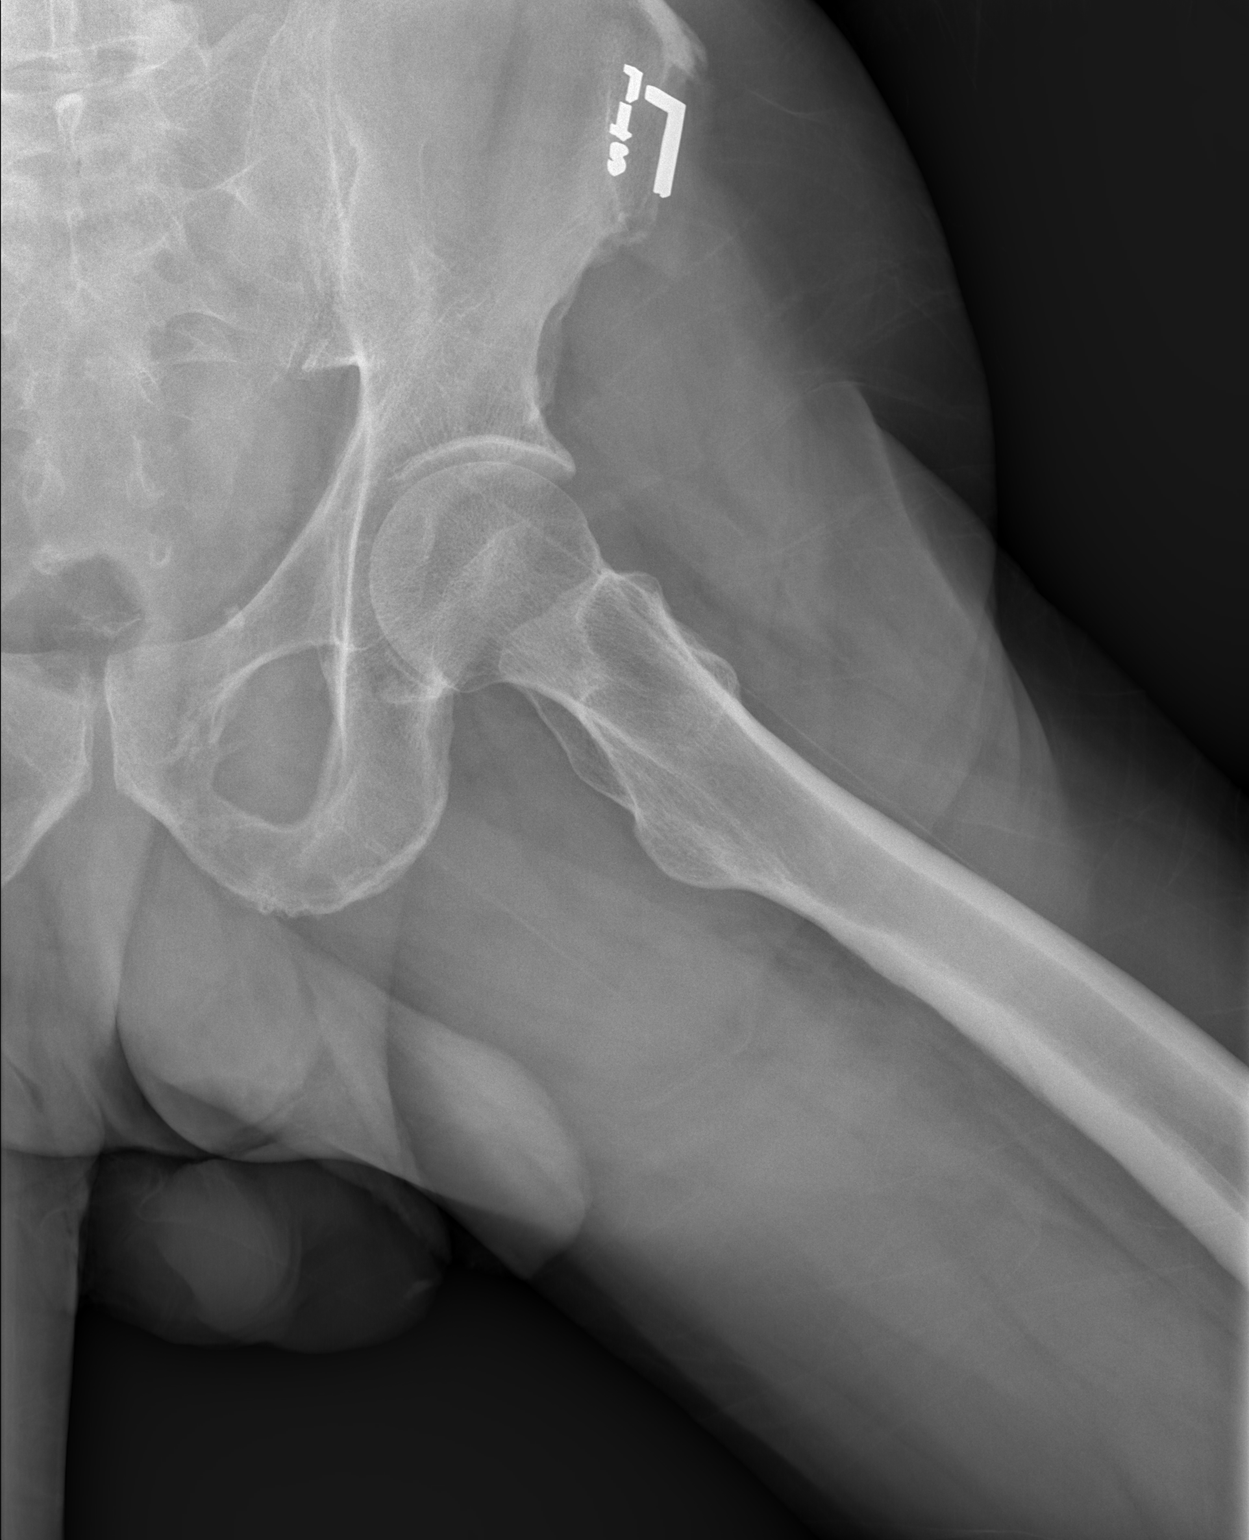

[3 of 3 positions shown; findings below may reference images not displayed]

FINDINGS: Mild symmetric degenerative change of the hips. No acute fracture or
dislocation. Mild degenerate change of the spine.
IMPRESSION: No acute findings.

## 2021-08-08 DEATH — deceased
